# Patient Record
Sex: Female | Born: 1945
Health system: Southern US, Community
[De-identification: ages and names within clinical notes are randomized; demographics above are authoritative.]

## PROBLEM LIST (undated history)

## (undated) DIAGNOSIS — K219 Gastro-esophageal reflux disease without esophagitis: Secondary | ICD-10-CM

## (undated) DIAGNOSIS — I447 Left bundle-branch block, unspecified: Secondary | ICD-10-CM

## (undated) DIAGNOSIS — M1990 Unspecified osteoarthritis, unspecified site: Secondary | ICD-10-CM

## (undated) DIAGNOSIS — J189 Pneumonia, unspecified organism: Secondary | ICD-10-CM

## (undated) HISTORY — PX: KNEE SURGERY: SHX244

## (undated) HISTORY — PX: EYE SURGERY: SHX253

## (undated) HISTORY — DX: Gastro-esophageal reflux disease without esophagitis: K21.9

## (undated) HISTORY — PX: LUMBAR FUSION: SHX111

## (undated) HISTORY — DX: Unspecified osteoarthritis, unspecified site: M19.90

## (undated) HISTORY — PX: TUBAL LIGATION: SHX77

## (undated) HISTORY — PX: JOINT REPLACEMENT: SHX530

---

## 1987-02-06 HISTORY — PX: TUBAL LIGATION: SHX77

## 2004-02-06 HISTORY — PX: JOINT REPLACEMENT: SHX530

## 2004-08-29 ENCOUNTER — Ambulatory Visit: Payer: Self-pay | Admitting: General Surgery

## 2004-09-21 ENCOUNTER — Ambulatory Visit: Payer: Self-pay | Admitting: Unknown Physician Specialty

## 2005-08-02 ENCOUNTER — Inpatient Hospital Stay: Payer: Self-pay | Admitting: General Practice

## 2005-09-24 ENCOUNTER — Ambulatory Visit: Payer: Self-pay | Admitting: Unknown Physician Specialty

## 2005-09-26 ENCOUNTER — Ambulatory Visit: Payer: Self-pay | Admitting: General Practice

## 2006-10-01 ENCOUNTER — Ambulatory Visit: Payer: Self-pay | Admitting: Unknown Physician Specialty

## 2006-12-03 ENCOUNTER — Ambulatory Visit: Payer: Self-pay | Admitting: Unknown Physician Specialty

## 2007-10-15 ENCOUNTER — Ambulatory Visit: Payer: Self-pay | Admitting: Unknown Physician Specialty

## 2007-10-28 ENCOUNTER — Ambulatory Visit: Payer: Self-pay | Admitting: Unknown Physician Specialty

## 2008-07-22 ENCOUNTER — Ambulatory Visit: Payer: Self-pay | Admitting: General Surgery

## 2008-10-02 ENCOUNTER — Emergency Department: Payer: Self-pay | Admitting: Emergency Medicine

## 2008-10-06 ENCOUNTER — Ambulatory Visit: Payer: Self-pay | Admitting: Family Medicine

## 2009-03-17 ENCOUNTER — Ambulatory Visit: Payer: Self-pay | Admitting: Unknown Physician Specialty

## 2009-09-13 ENCOUNTER — Ambulatory Visit: Payer: Self-pay | Admitting: General Surgery

## 2010-11-09 ENCOUNTER — Ambulatory Visit: Payer: Self-pay | Admitting: Unknown Physician Specialty

## 2011-05-14 ENCOUNTER — Ambulatory Visit: Payer: Self-pay | Admitting: Internal Medicine

## 2011-05-24 ENCOUNTER — Ambulatory Visit: Payer: Self-pay | Admitting: Specialist

## 2011-07-05 ENCOUNTER — Ambulatory Visit: Payer: Self-pay | Admitting: Specialist

## 2012-08-21 ENCOUNTER — Encounter: Payer: Self-pay | Admitting: *Deleted

## 2013-03-02 DIAGNOSIS — A088 Other specified intestinal infections: Secondary | ICD-10-CM | POA: Diagnosis not present

## 2013-07-29 IMAGING — US US EXTREM LOW VENOUS*R*
1 series · 17 of 24 positions shown · non-contrast
Comparison: none

REASON FOR EXAM: CR [id number] RT Leg Pain Swelling Eval DVT
COMMENTS:

[Series 1: us extrem low venous*right* · 17 of 24 slices shown]
[im 1/24]
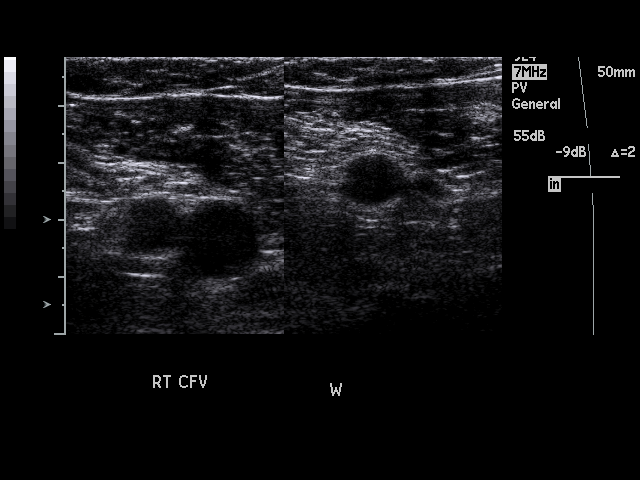
[im 3/24]
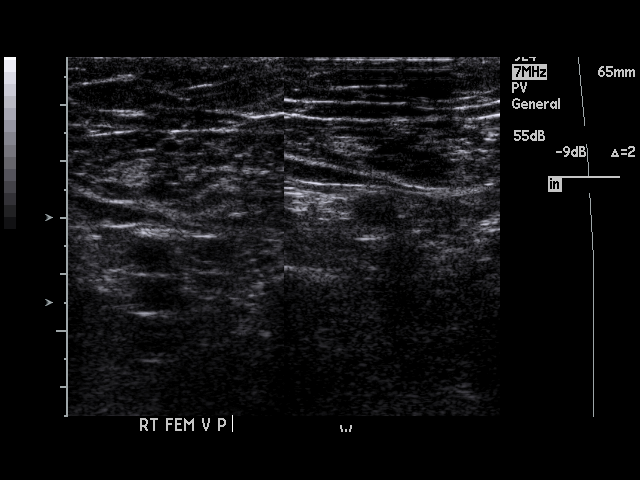
[im 4/24]
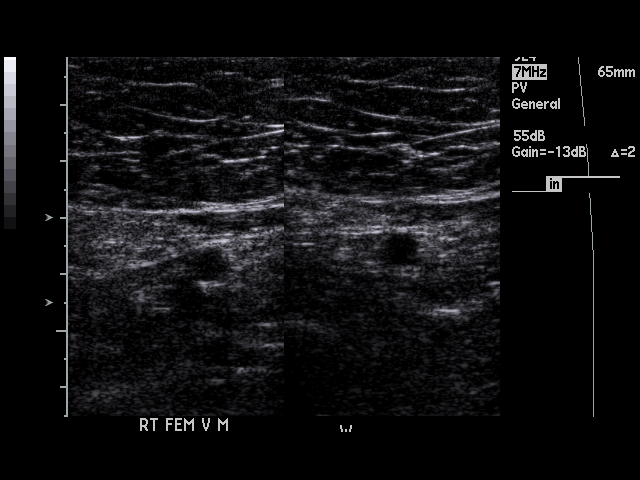
[im 5/24]
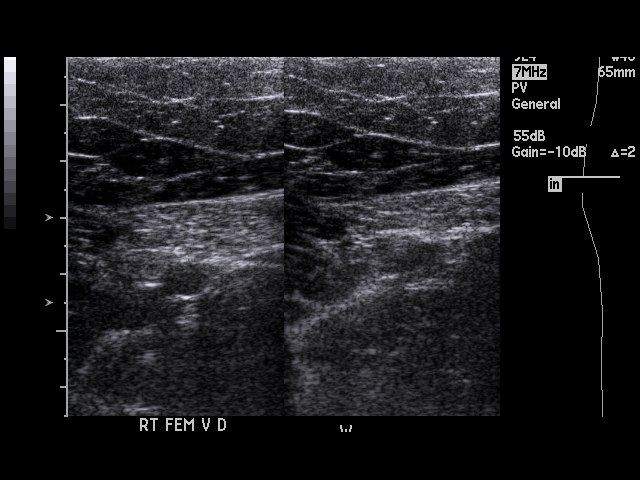
[im 7/24]
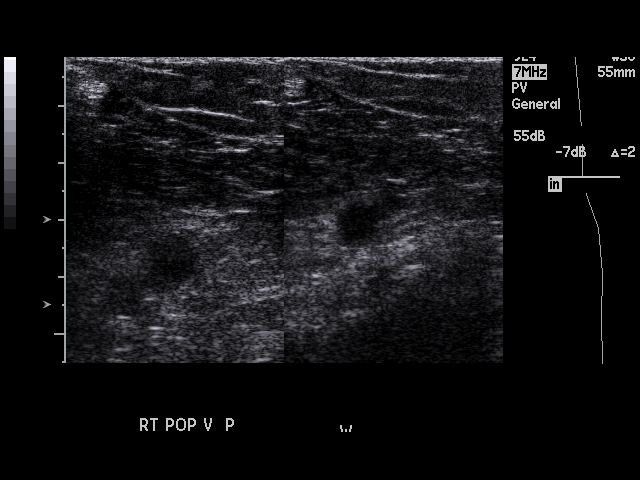
[im 8/24]
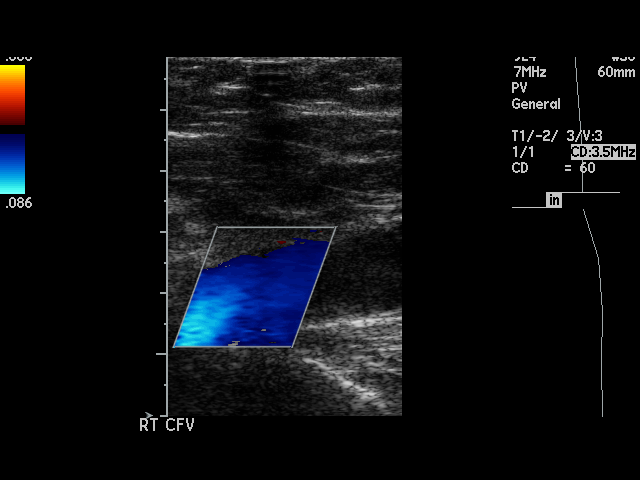
[im 10/24]
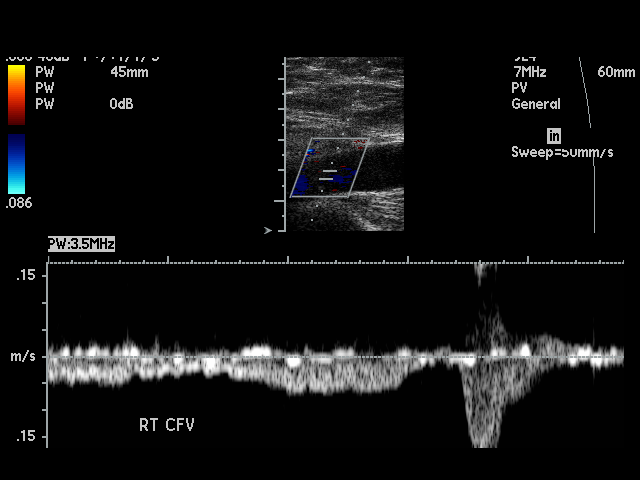
[im 11/24]
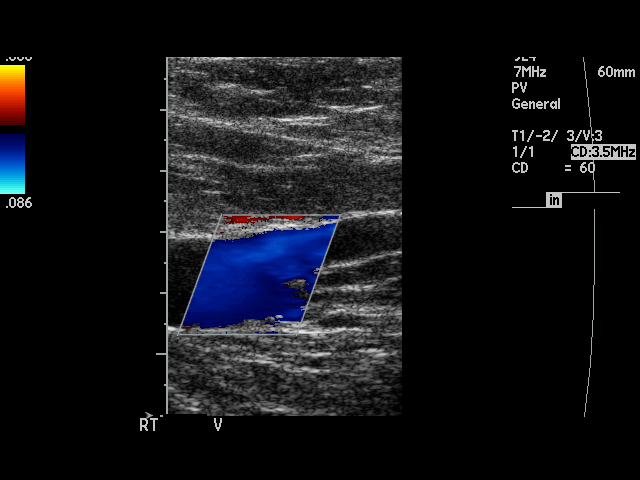
[im 13/24]
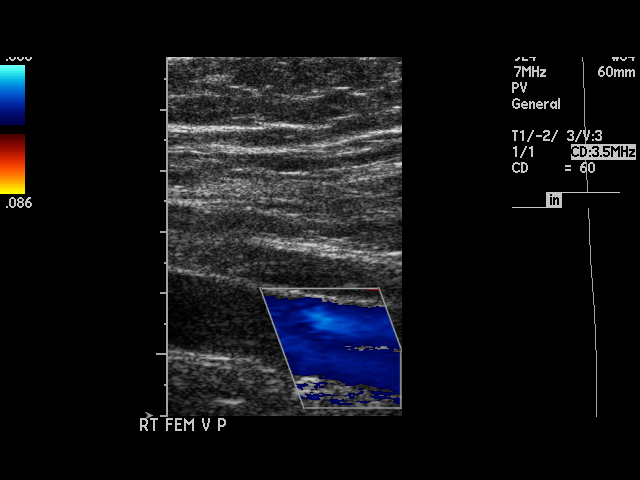
[im 14/24]
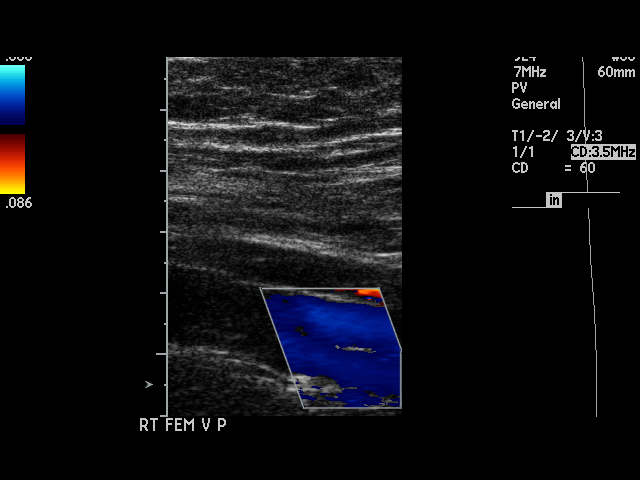
[im 15/24]
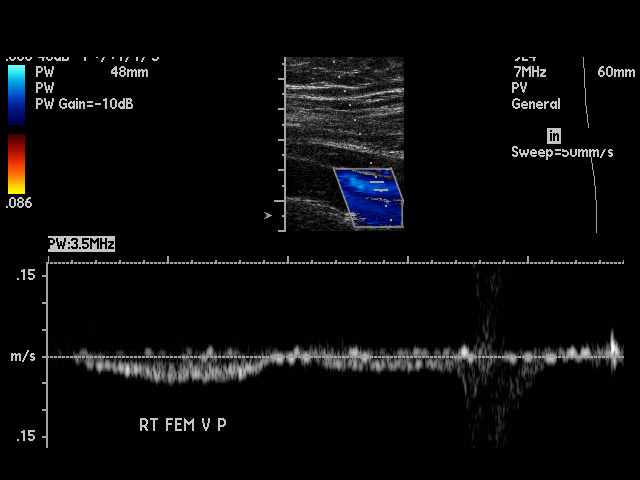
[im 17/24]
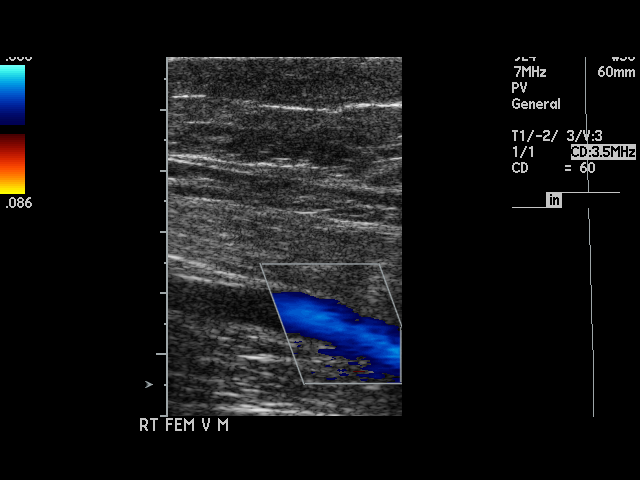
[im 18/24]
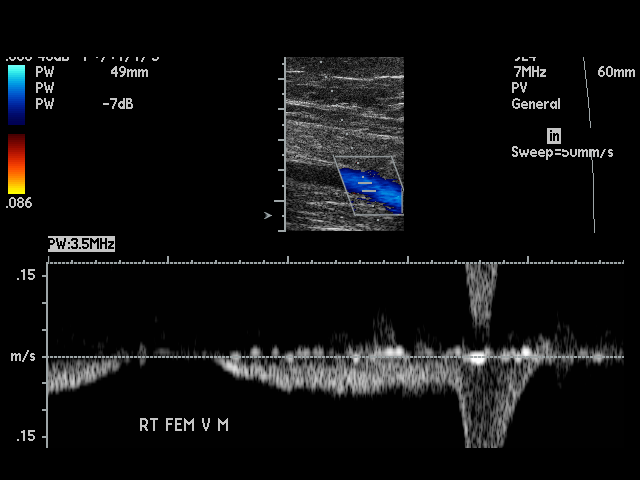
[im 20/24]
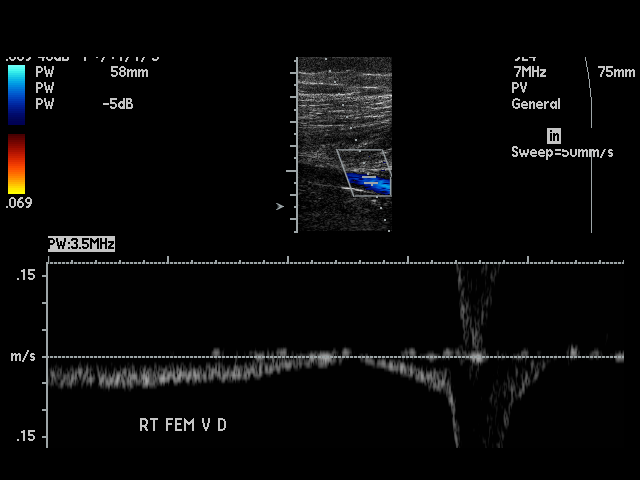
[im 21/24]
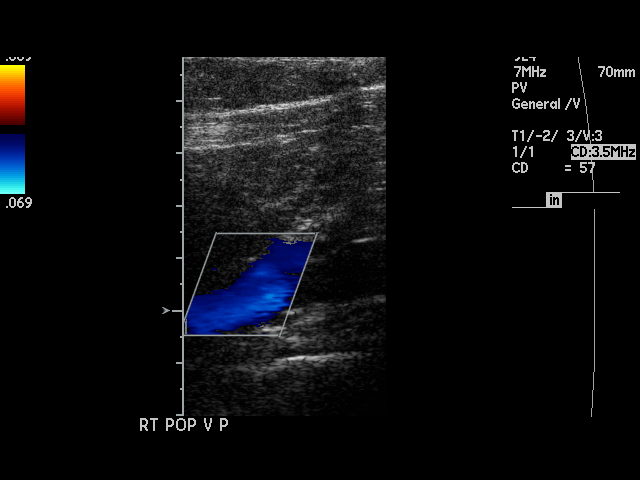
[im 22/24]
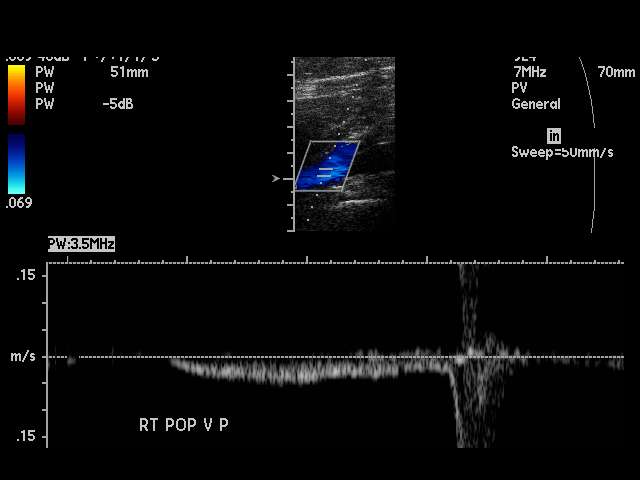
[im 24/24]
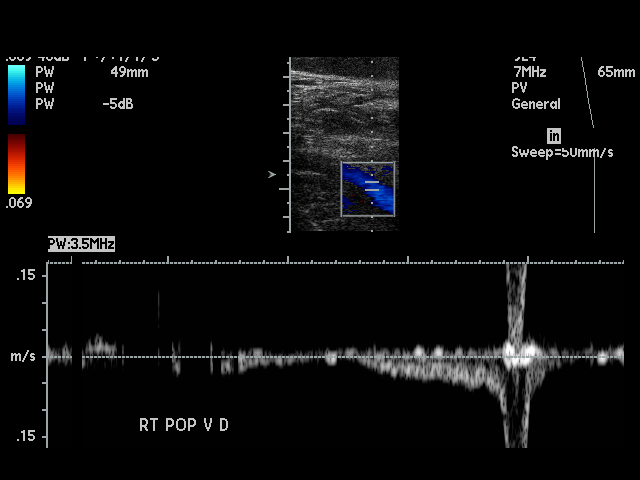

[17 of 24 positions shown; findings below may reference images not displayed]

PROCEDURE:     ROSALEE - ROSALEE DOPPLER VEINS RT LEG EXTR  - May 14, 2011  [DATE]

RESULT:     The phasic, augmentation and Valsalva flow waveforms are normal
in appearance. The right femoral and popliteal vein shows complete
compressibility throughout its course. Doppler examination shows no
occlusion or evidence for deep vein thrombosis.
IMPRESSION: 1.     No deep vein thrombosis is identified in the right leg.

## 2014-05-04 DIAGNOSIS — Z01419 Encounter for gynecological examination (general) (routine) without abnormal findings: Secondary | ICD-10-CM | POA: Diagnosis not present

## 2014-05-04 DIAGNOSIS — Z1211 Encounter for screening for malignant neoplasm of colon: Secondary | ICD-10-CM | POA: Diagnosis not present

## 2014-05-04 DIAGNOSIS — Z1231 Encounter for screening mammogram for malignant neoplasm of breast: Secondary | ICD-10-CM | POA: Diagnosis not present

## 2014-05-17 DIAGNOSIS — N390 Urinary tract infection, site not specified: Secondary | ICD-10-CM | POA: Diagnosis not present

## 2015-01-03 DIAGNOSIS — J069 Acute upper respiratory infection, unspecified: Secondary | ICD-10-CM | POA: Diagnosis not present

## 2015-01-03 DIAGNOSIS — R05 Cough: Secondary | ICD-10-CM | POA: Diagnosis not present

## 2015-02-09 ENCOUNTER — Ambulatory Visit (INDEPENDENT_AMBULATORY_CARE_PROVIDER_SITE_OTHER): Payer: Medicare Other | Admitting: Physician Assistant

## 2015-02-09 ENCOUNTER — Encounter: Payer: Self-pay | Admitting: Physician Assistant

## 2015-02-09 VITALS — BP 118/68 | HR 86 | Temp 97.7°F | Resp 16 | Ht 67.0 in | Wt 175.6 lb

## 2015-02-09 DIAGNOSIS — E7800 Pure hypercholesterolemia, unspecified: Secondary | ICD-10-CM | POA: Diagnosis not present

## 2015-02-09 DIAGNOSIS — Z7189 Other specified counseling: Secondary | ICD-10-CM

## 2015-02-09 DIAGNOSIS — Z87891 Personal history of nicotine dependence: Secondary | ICD-10-CM

## 2015-02-09 DIAGNOSIS — Z23 Encounter for immunization: Secondary | ICD-10-CM

## 2015-02-09 DIAGNOSIS — Z7689 Persons encountering health services in other specified circumstances: Secondary | ICD-10-CM

## 2015-02-09 NOTE — Patient Instructions (Signed)
Health Maintenance, Female Adopting a healthy lifestyle and getting preventive care can go a long way to promote health and wellness. Talk with your health care provider about what schedule of regular examinations is right for you. This is a good chance for you to check in with your provider about disease prevention and staying healthy. In between checkups, there are plenty of things you can do on your own. Experts have done a lot of research about which lifestyle changes and preventive measures are most likely to keep you healthy. Ask your health care provider for more information. WEIGHT AND DIET  Eat a healthy diet  Be sure to include plenty of vegetables, fruits, low-fat dairy products, and lean protein.  Do not eat a lot of foods high in solid fats, added sugars, or salt.  Get regular exercise. This is one of the most important things you can do for your health.  Most adults should exercise for at least 150 minutes each week. The exercise should increase your heart rate and make you sweat (moderate-intensity exercise).  Most adults should also do strengthening exercises at least twice a week. This is in addition to the moderate-intensity exercise.  Maintain a healthy weight  Body mass index (BMI) is a measurement that can be used to identify possible weight problems. It estimates body fat based on height and weight. Your health care provider can help determine your BMI and help you achieve or maintain a healthy weight.  For females 20 years of age and older:   A BMI below 18.5 is considered underweight.  A BMI of 18.5 to 24.9 is normal.  A BMI of 25 to 29.9 is considered overweight.  A BMI of 30 and above is considered obese.  Watch levels of cholesterol and blood lipids  You should start having your blood tested for lipids and cholesterol at 68 years of age, then have this test every 5 years.  You may need to have your cholesterol levels checked more often if:  Your lipid  or cholesterol levels are high.  You are older than 68 years of age.  You are at high risk for heart disease.  CANCER SCREENING   Lung Cancer  Lung cancer screening is recommended for adults 55-80 years old who are at high risk for lung cancer because of a history of smoking.  A yearly low-dose CT scan of the lungs is recommended for people who:  Currently smoke.  Have quit within the past 15 years.  Have at least a 30-pack-year history of smoking. A pack year is smoking an average of one pack of cigarettes a day for 1 year.  Yearly screening should continue until it has been 15 years since you quit.  Yearly screening should stop if you develop a health problem that would prevent you from having lung cancer treatment.  Breast Cancer  Practice breast self-awareness. This means understanding how your breasts normally appear and feel.  It also means doing regular breast self-exams. Let your health care provider know about any changes, no matter how small.  If you are in your 20s or 30s, you should have a clinical breast exam (CBE) by a health care provider every 1-3 years as part of a regular health exam.  If you are 40 or older, have a CBE every year. Also consider having a breast X-ray (mammogram) every year.  If you have a family history of breast cancer, talk to your health care provider about genetic screening.  If you   are at high risk for breast cancer, talk to your health care provider about having an MRI and a mammogram every year.  Breast cancer gene (BRCA) assessment is recommended for women who have family members with BRCA-related cancers. BRCA-related cancers include:  Breast.  Ovarian.  Tubal.  Peritoneal cancers.  Results of the assessment will determine the need for genetic counseling and BRCA1 and BRCA2 testing. Cervical Cancer Your health care provider may recommend that you be screened regularly for cancer of the pelvic organs (ovaries, uterus, and  vagina). This screening involves a pelvic examination, including checking for microscopic changes to the surface of your cervix (Pap test). You may be encouraged to have this screening done every 3 years, beginning at age 21.  For women ages 30-65, health care providers may recommend pelvic exams and Pap testing every 3 years, or they may recommend the Pap and pelvic exam, combined with testing for human papilloma virus (HPV), every 5 years. Some types of HPV increase your risk of cervical cancer. Testing for HPV may also be done on women of any age with unclear Pap test results.  Other health care providers may not recommend any screening for nonpregnant women who are considered low risk for pelvic cancer and who do not have symptoms. Ask your health care provider if a screening pelvic exam is right for you.  If you have had past treatment for cervical cancer or a condition that could lead to cancer, you need Pap tests and screening for cancer for at least 20 years after your treatment. If Pap tests have been discontinued, your risk factors (such as having a new sexual partner) need to be reassessed to determine if screening should resume. Some women have medical problems that increase the chance of getting cervical cancer. In these cases, your health care provider may recommend more frequent screening and Pap tests. Colorectal Cancer  This type of cancer can be detected and often prevented.  Routine colorectal cancer screening usually begins at 68 years of age and continues through 68 years of age.  Your health care provider may recommend screening at an earlier age if you have risk factors for colon cancer.  Your health care provider may also recommend using home test kits to check for hidden blood in the stool.  A small camera at the end of a tube can be used to examine your colon directly (sigmoidoscopy or colonoscopy). This is done to check for the earliest forms of colorectal  cancer.  Routine screening usually begins at age 50.  Direct examination of the colon should be repeated every 5-10 years through 68 years of age. However, you may need to be screened more often if early forms of precancerous polyps or small growths are found. Skin Cancer  Check your skin from head to toe regularly.  Tell your health care provider about any new moles or changes in moles, especially if there is a change in a mole's shape or color.  Also tell your health care provider if you have a mole that is larger than the size of a pencil eraser.  Always use sunscreen. Apply sunscreen liberally and repeatedly throughout the day.  Protect yourself by wearing long sleeves, pants, a wide-brimmed hat, and sunglasses whenever you are outside. HEART DISEASE, DIABETES, AND HIGH BLOOD PRESSURE   High blood pressure causes heart disease and increases the risk of stroke. High blood pressure is more likely to develop in:  People who have blood pressure in the high end   of the normal range (130-139/85-89 mm Hg).  People who are overweight or obese.  People who are African American.  If you are 38-23 years of age, have your blood pressure checked every 3-5 years. If you are 61 years of age or older, have your blood pressure checked every year. You should have your blood pressure measured twice--once when you are at a hospital or clinic, and once when you are not at a hospital or clinic. Record the average of the two measurements. To check your blood pressure when you are not at a hospital or clinic, you can use:  An automated blood pressure machine at a pharmacy.  A home blood pressure monitor.  If you are between 45 years and 39 years old, ask your health care provider if you should take aspirin to prevent strokes.  Have regular diabetes screenings. This involves taking a blood sample to check your fasting blood sugar level.  If you are at a normal weight and have a low risk for diabetes,  have this test once every three years after 68 years of age.  If you are overweight and have a high risk for diabetes, consider being tested at a younger age or more often. PREVENTING INFECTION  Hepatitis B  If you have a higher risk for hepatitis B, you should be screened for this virus. You are considered at high risk for hepatitis B if:  You were born in a country where hepatitis B is common. Ask your health care provider which countries are considered high risk.  Your parents were born in a high-risk country, and you have not been immunized against hepatitis B (hepatitis B vaccine).  You have HIV or AIDS.  You use needles to inject street drugs.  You live with someone who has hepatitis B.  You have had sex with someone who has hepatitis B.  You get hemodialysis treatment.  You take certain medicines for conditions, including cancer, organ transplantation, and autoimmune conditions. Hepatitis C  Blood testing is recommended for:  Everyone born from [birth year] through 1963.  Anyone with known risk factors for hepatitis C. Sexually transmitted infections (STIs)  You should be screened for sexually transmitted infections (STIs) including gonorrhea and chlamydia if:  You are sexually active and are younger than 68 years of age.  You are older than 68 years of age and your health care provider tells you that you are at risk for this type of infection.  Your sexual activity has changed since you were last screened and you are at an increased risk for chlamydia or gonorrhea. Ask your health care provider if you are at risk.  If you do not have HIV, but are at risk, it may be recommended that you take a prescription medicine daily to prevent HIV infection. This is called pre-exposure prophylaxis (PrEP). You are considered at risk if:  You are sexually active and do not regularly use condoms or know the HIV status of your partner(s).  You take drugs by injection.  You are sexually  active with a partner who has HIV. Talk with your health care provider about whether you are at high risk of being infected with HIV. If you choose to begin PrEP, you should first be tested for HIV. You should then be tested every 3 months for as long as you are taking PrEP.  PREGNANCY   If you are premenopausal and you may become pregnant, ask your health care provider about preconception counseling.  If you may  become pregnant, take 400 to 800 micrograms (mcg) of folic acid every day.  If you want to prevent pregnancy, talk to your health care provider about birth control (contraception). OSTEOPOROSIS AND MENOPAUSE   Osteoporosis is a disease in which the bones lose minerals and strength with aging. This can result in serious bone fractures. Your risk for osteoporosis can be identified using a bone density scan.  If you are 61 years of age or older, or if you are at risk for osteoporosis and fractures, ask your health care provider if you should be screened.  Ask your health care provider whether you should take a calcium or vitamin D supplement to lower your risk for osteoporosis.  Menopause may have certain physical symptoms and risks.  Hormone replacement therapy may reduce some of these symptoms and risks. Talk to your health care provider about whether hormone replacement therapy is right for you.  HOME CARE INSTRUCTIONS   Schedule regular health, dental, and eye exams.  Stay current with your immunizations.   Do not use any tobacco products including cigarettes, chewing tobacco, or electronic cigarettes.  If you are pregnant, do not drink alcohol.  If you are breastfeeding, limit how much and how often you drink alcohol.  Limit alcohol intake to no more than 1 drink per day for nonpregnant women. One drink equals 12 ounces of beer, 5 ounces of wine, or 1 ounces of hard liquor.  Do not use street drugs.  Do not share needles.  Ask your health care provider for help if  you need support or information about quitting drugs.  Tell your health care provider if you often feel depressed.  Tell your health care provider if you have ever been abused or do not feel safe at home.   This information is not intended to replace advice given to you by your health care provider. Make sure you discuss any questions you have with your health care provider.   Document Released: 08/07/2010 Document Revised: 02/12/2014 Document Reviewed: 12/24/2012 Elsevier Interactive Patient Education [employer].

## 2015-02-09 NOTE — Progress Notes (Signed)
Patient: Vanessa Dominguez, Female    DOB: 04/25/1945, 70 y.o.   MRN: [id number] Visit Date: 02/09/2015  Today's Provider: Margaretann Loveless, [name]   Chief Complaint  Patient presents with  . Establish Care   Subjective:    Establish Care:  Vanessa Dominguez is a 70 y.o. female who is here to establish care as a new patient. She feels well. She reports not exercising. She reports she is sleeping fairly well.  Patient had a mammogram,pap, and bone density 08/2014 by Dr. Arvil Chaco.  She has no complaints today.  She is in need of her Pneumovax and flu vaccine.    She does have history of TKR of left knee in 2007.  She does have history of hypercholesterolemia. -----------------------------------------------------------------   Review of Systems  Constitutional: Negative.   HENT: Negative.   Eyes: Negative.   Respiratory: Negative.   Cardiovascular: Negative.   Gastrointestinal: Negative.   Endocrine: Negative.   Genitourinary: Negative.   Musculoskeletal: Negative.   Skin: Negative.   Neurological: Negative.   Hematological: Negative.   Psychiatric/Behavioral: Negative.     Social History      She  reports that she has quit smoking. She does not have any smokeless tobacco history on file. She reports that she does not drink alcohol or use illicit drugs.       Social History   Social History  . Marital Status: Married    Spouse Name: N/A  . Number of Children: N/A  . Years of Education: N/A   Social History Main Topics  . Smoking status: Former Games developer  . Smokeless tobacco: None  . Alcohol Use: No  . Drug Use: No  . Sexual Activity: Not Asked   Other Topics Concern  . None   Social History Narrative    Past Medical History  Diagnosis Date  . Arthritis      There are no active problems to display for this patient.   Past Surgical History  Procedure Laterality Date  . Knee surgery Left [phone]  . Joint replacement Left 2006    Lt knee   . Tubal ligation  1989  . Cesarean section      Family History        Family Status  Relation Status Death Age  . Mother Deceased 52  . Father Deceased 61    Died from cancer        Her family history is not on file.    No Known Allergies  Previous Medications   No medications on file    Patient Care Team: Margaretann Loveless, [name] as PCP - General (Family Medicine)     Objective:   Vitals: BP 118/68 mmHg  Pulse 86  Temp(Src) 97.7 F (36.5 C) (Oral)  Resp 16  Ht 5\' 7"  (1.702 m)  Wt 175 lb 9.6 oz (79.652 kg)  BMI 27.50 kg/m2   Physical Exam  Constitutional: She is oriented to person, place, and time. She appears well-developed and well-nourished. No distress.  HENT:  Head: Normocephalic and atraumatic.  Right Ear: External ear normal.  Left Ear: External ear normal.  Nose: Nose normal.  Mouth/Throat: Oropharynx is clear and moist. No oropharyngeal exudate.  Eyes: Conjunctivae and EOM are normal. Pupils are equal, round, and reactive to light. Right eye exhibits no discharge. Left eye exhibits no discharge. No scleral icterus.  Neck: Normal range of motion. Neck supple. No JVD present. No tracheal deviation present. No  thyromegaly present.  Cardiovascular: Normal rate, regular rhythm, normal heart sounds and intact distal pulses.  Exam reveals no gallop and no friction rub.   No murmur heard. Pulmonary/Chest: Effort normal and breath sounds normal. No respiratory distress. She has no wheezes. She has no rales. She exhibits no tenderness.  Abdominal: Soft. Bowel sounds are normal. She exhibits no distension and no mass. There is no tenderness. There is no rebound and no guarding.  Musculoskeletal: Normal range of motion. She exhibits no edema or tenderness.  Lymphadenopathy:    She has no cervical adenopathy.  Neurological: She is alert and oriented to person, place, and time.  Skin: Skin is warm and dry. No rash noted. She is not diaphoretic.  Psychiatric: She  has a normal mood and affect. Her behavior is normal. Judgment and thought content normal.  Vitals reviewed.    Depression Screen No flowsheet data found.    Assessment & Plan:     Routine Health Maintenance and Physical Exam  1. Establishing care with new doctor, encounter for Sees Dr. Arvil ChacoVan Dalen also for OB/GYN and mammogram.  2. Need for influenza vaccination Flu vaccine given today without complication. - Flu Vaccine QUAD 36+ mos IM  3. Need for Streptococcus pneumoniae vaccination Pneumovax given today without complication.  She has already had Prevnar. - Pneumococcal polysaccharide vaccine 23-valent greater than or equal to 2yo subcutaneous/IM  4. Former smoker Will check labs as below and f/u pending lab results. If labs are WNL and stable will see back in one year for repeat exam. - Comprehensive Metabolic Panel (CMET) - CBC with Differential  5. Hypercholesteremia History of this.  Will check labs and f/u pending results. - Lipid Profile   Exercise Activities and Dietary recommendations Goals    None       There is no immunization history on file for this patient.  Health Maintenance  Topic Date Due  . Hepatitis C Screening  [birth year]  . TETANUS/TDAP  11/28/1966  . MAMMOGRAM  11/27/1997  . COLONOSCOPY  11/27/1997  . ZOSTAVAX  11/28/2007  . DEXA SCAN  11/27/2012  . PNA vac Low Risk Adult (1 of 2 - PCV13) 11/27/2012  . INFLUENZA VACCINE  09/06/2014      Discussed health benefits of physical activity, and encouraged her to engage in regular exercise appropriate for her age and condition.    --------------------------------------------------------------------

## 2015-02-10 ENCOUNTER — Telehealth: Payer: Self-pay

## 2015-02-10 LAB — CBC WITH DIFFERENTIAL/PLATELET
Basophils Absolute: 0 10*3/uL (ref 0.0–0.2)
Basos: 1 %
EOS (ABSOLUTE): 0.2 10*3/uL (ref 0.0–0.4)
EOS: 2 %
HEMATOCRIT: 42.2 % (ref 34.0–46.6)
HEMOGLOBIN: 14.5 g/dL (ref 11.1–15.9)
Immature Grans (Abs): 0 10*3/uL (ref 0.0–0.1)
Immature Granulocytes: 0 %
Lymphocytes Absolute: 2.8 10*3/uL (ref 0.7–3.1)
Lymphs: 34 %
MCH: 29.5 pg (ref 26.6–33.0)
MCHC: 34.4 g/dL (ref 31.5–35.7)
MCV: 86 fL (ref 79–97)
Monocytes Absolute: 0.6 10*3/uL (ref 0.1–0.9)
Monocytes: 8 %
NEUTROS PCT: 55 %
Neutrophils Absolute: 4.7 10*3/uL (ref 1.4–7.0)
Platelets: 321 10*3/uL (ref 150–379)
RBC: 4.92 x10E6/uL (ref 3.77–5.28)
RDW: 13.4 % (ref 12.3–15.4)
WBC: 8.3 10*3/uL (ref 3.4–10.8)

## 2015-02-10 LAB — LIPID PANEL
CHOL/HDL RATIO: 3.2 ratio (ref 0.0–4.4)
CHOLESTEROL TOTAL: 212 mg/dL — AB (ref 100–199)
HDL: 66 mg/dL (ref >39)
LDL Calculated: 107 mg/dL — ABNORMAL HIGH (ref 0–99)
TRIGLYCERIDES: 197 mg/dL — AB (ref 0–149)
VLDL Cholesterol Cal: 39 mg/dL (ref 5–40)

## 2015-02-10 LAB — COMPREHENSIVE METABOLIC PANEL
ALK PHOS: 100 IU/L (ref 39–117)
ALT: 20 IU/L (ref 0–32)
AST: 20 IU/L (ref 0–40)
Albumin/Globulin Ratio: 2 (ref 1.1–2.5)
Albumin: 4.5 g/dL (ref 3.6–4.8)
BUN/Creatinine Ratio: 18 (ref 11–26)
BUN: 16 mg/dL (ref 8–27)
Bilirubin Total: 0.2 mg/dL (ref 0.0–1.2)
CO2: 24 mmol/L (ref 18–29)
CREATININE: 0.9 mg/dL (ref 0.57–1.00)
Calcium: 9.7 mg/dL (ref 8.7–10.3)
Chloride: 98 mmol/L (ref 96–106)
GFR calc Af Amer: 77 mL/min/{1.73_m2} (ref >59)
GFR calc non Af Amer: 66 mL/min/{1.73_m2} (ref >59)
GLOBULIN, TOTAL: 2.2 g/dL (ref 1.5–4.5)
Glucose: 85 mg/dL (ref 65–99)
POTASSIUM: 4.9 mmol/L (ref 3.5–5.2)
SODIUM: 139 mmol/L (ref 134–144)
Total Protein: 6.7 g/dL (ref 6.0–8.5)

## 2015-02-10 NOTE — Telephone Encounter (Signed)
Patient advised as directed below. Patient voiced understanding.  Thanks,  -Megahn Killings 

## 2015-02-10 NOTE — Telephone Encounter (Signed)
-----   Message from Margaretann LovelessJennifer M Burnette, [place]PA-C sent at 02/10/2015  8:52 AM EST ----- All labs are within normal limits and stable with the exception of cholesterol which is very borderline elevated. Total cholesterol is slightly elevated at 212. I would like to see this below 200 if possible. LDL (bad) cholesterol is elevated at 107. I would like this number below 100 if possible. Triglycerides are also elevated at 197. I would like to see this below 150. Triglycerides are most closely related with your diet. Try to avoid eating high fat, high cholesterol foods. Make sure to try to be physically active with doing physical activity for 30-40 minutes 3-4 days per week. HDL (good) cholesterol is elevated as well at 66. This is great. This does offer cardioprotection and keeps you from having to be on any cholesterol-lowering medication. We will recheck labs in one year. Thanks! -JB

## 2015-09-28 DIAGNOSIS — H2513 Age-related nuclear cataract, bilateral: Secondary | ICD-10-CM | POA: Diagnosis not present

## 2015-09-29 ENCOUNTER — Encounter: Payer: Self-pay | Admitting: Family Medicine

## 2015-09-29 ENCOUNTER — Ambulatory Visit (INDEPENDENT_AMBULATORY_CARE_PROVIDER_SITE_OTHER): Payer: Medicare Other | Admitting: Family Medicine

## 2015-09-29 VITALS — BP 112/70 | HR 92 | Temp 97.9°F | Resp 16 | Wt 164.0 lb

## 2015-09-29 DIAGNOSIS — L259 Unspecified contact dermatitis, unspecified cause: Secondary | ICD-10-CM

## 2015-09-29 MED ORDER — PREDNISONE 20 MG PO TABS: ORAL_TABLET | ORAL | 0 refills | Status: DC

## 2015-09-29 MED ORDER — FLUOCINONIDE 0.05 % EX CREA: 1.0000 "application " | TOPICAL_CREAM | Freq: Three times a day (TID) | CUTANEOUS | 0 refills | Status: DC

## 2015-09-29 NOTE — Progress Notes (Signed)
Subjective:     Patient ID: Vanessa Dominguez, female   DOB: 10-08-1945, 70 y.o.   MRN: [id number]  HPI  Chief Complaint  Patient presents with  . Rash    Patient comes in office today with concerns of posion ivy. Patient states that she was pulling weeds the other ay when she noticed rash appear on her left arm, since rash started patient reports that it is covering her upper and lower extremity. Patient also reports itching of the face, patient has tried otc Anti-itch Spray  "I could not sleep last night due to the itching". Tried oral Benadryl with little relief. States she was wearing shorts and a short sleeved shirt while working.   Review of Systems     Objective:   Physical Exam  Constitutional: She appears well-developed and well-nourished. No distress.  Skin:  Papules in both a linear and discrete presentation on arms and upper chest. Face appear to be spared at the present time.       Assessment:    1. Contact dermatitis - fluocinonide cream (LIDEX) 0.05 %; Apply 1 application topically 3 (three) times daily. To poison ivy rash  Dispense: 30 g; Refill: 0 - predniSONE (DELTASONE) 20 MG tablet; Taper as follows: 3 pills for 4 days, two pills for 4 days, one pill for four days  Dispense: 24 tablet; Refill: 0    Plan:   To stat prednisone for facial involvement or no relief with topical steroids.

## 2015-09-29 NOTE — Patient Instructions (Signed)
Start the cream. The itching will improve before the rash goes away. If your rash develops in other areas especially the face or is not improving start the oral prednisone.

## 2015-10-27 DIAGNOSIS — H2512 Age-related nuclear cataract, left eye: Secondary | ICD-10-CM | POA: Diagnosis not present

## 2015-12-05 DIAGNOSIS — H2512 Age-related nuclear cataract, left eye: Secondary | ICD-10-CM | POA: Diagnosis not present

## 2015-12-13 ENCOUNTER — Ambulatory Visit: Payer: Medicare Other | Admitting: Anesthesiology

## 2015-12-13 ENCOUNTER — Ambulatory Visit
Admission: RE | Admit: 2015-12-13 | Discharge: 2015-12-13 | Disposition: A | Discharge status: Home / Self Care | Payer: Medicare Other | Source: Ambulatory Visit | Attending: Ophthalmology | Admitting: Ophthalmology

## 2015-12-13 ENCOUNTER — Encounter
Admission: RE | Disposition: A | Discharge status: Home / Self Care | Payer: Self-pay | Source: Ambulatory Visit | Attending: Ophthalmology

## 2015-12-13 ENCOUNTER — Encounter: Payer: Self-pay | Admitting: *Deleted

## 2015-12-13 DIAGNOSIS — H2512 Age-related nuclear cataract, left eye: Secondary | ICD-10-CM | POA: Insufficient documentation

## 2015-12-13 DIAGNOSIS — M1990 Unspecified osteoarthritis, unspecified site: Secondary | ICD-10-CM | POA: Diagnosis not present

## 2015-12-13 DIAGNOSIS — Z87891 Personal history of nicotine dependence: Secondary | ICD-10-CM | POA: Diagnosis not present

## 2015-12-13 HISTORY — DX: Pneumonia, unspecified organism: J18.9

## 2015-12-13 HISTORY — PX: CATARACT EXTRACTION W/PHACO: SHX586

## 2015-12-13 SURGERY — PHACOEMULSIFICATION, CATARACT, WITH IOL INSERTION
Anesthesia: Monitor Anesthesia Care | Site: Eye | Laterality: Left | Wound class: Clean

## 2015-12-13 MED ORDER — CARBACHOL 0.01 % IO SOLN
INTRAOCULAR | Status: DC | PRN
  Administered 2015-12-13: 0.5 mL via INTRAOCULAR

## 2015-12-13 MED ORDER — ARMC OPHTHALMIC DILATING DROPS
1.0000 "application " | OPHTHALMIC | Status: DC | PRN
  Administered 2015-12-13 (×3): 1 via OPHTHALMIC

## 2015-12-13 MED ORDER — LIDOCAINE HCL (PF) 4 % IJ SOLN
INTRAMUSCULAR | Status: AC
  Filled 2015-12-13: qty 5

## 2015-12-13 MED ORDER — FENTANYL CITRATE (PF) 100 MCG/2ML IJ SOLN
INTRAMUSCULAR | Status: DC | PRN
  Administered 2015-12-13: 50 ug via INTRAVENOUS

## 2015-12-13 MED ORDER — LIDOCAINE HCL (PF) 4 % IJ SOLN
INTRAOCULAR | Status: DC | PRN
  Administered 2015-12-13: 4 mL via OPHTHALMIC

## 2015-12-13 MED ORDER — SODIUM CHLORIDE 0.9 % IV SOLN
INTRAVENOUS | Status: DC
  Administered 2015-12-13 (×2): via INTRAVENOUS

## 2015-12-13 MED ORDER — MIDAZOLAM HCL 2 MG/2ML IJ SOLN
INTRAMUSCULAR | Status: DC | PRN
  Administered 2015-12-13: 1 mg via INTRAVENOUS

## 2015-12-13 MED ORDER — NA CHONDROIT SULF-NA HYALURON 40-17 MG/ML IO SOLN
INTRAOCULAR | Status: DC | PRN
  Administered 2015-12-13: 1 mL via INTRAOCULAR

## 2015-12-13 MED ORDER — NA CHONDROIT SULF-NA HYALURON 40-17 MG/ML IO SOLN
INTRAOCULAR | Status: AC
  Filled 2015-12-13: qty 1

## 2015-12-13 MED ORDER — POVIDONE-IODINE 5 % OP SOLN
OPHTHALMIC | Status: AC
  Filled 2015-12-13: qty 30

## 2015-12-13 MED ORDER — EPINEPHRINE PF 1 MG/ML IJ SOLN
INTRAOCULAR | Status: DC | PRN
  Administered 2015-12-13: 200 mL via OPHTHALMIC

## 2015-12-13 MED ORDER — MOXIFLOXACIN HCL 0.5 % OP SOLN
1.0000 [drp] | OPHTHALMIC | Status: DC | PRN
  Administered 2015-12-13 (×3): 1 [drp] via OPHTHALMIC

## 2015-12-13 MED ORDER — EPINEPHRINE PF 1 MG/ML IJ SOLN
INTRAMUSCULAR | Status: AC
  Filled 2015-12-13: qty 2

## 2015-12-13 MED ORDER — MOXIFLOXACIN HCL 0.5 % OP SOLN
OPHTHALMIC | Status: DC | PRN
  Administered 2015-12-13: 9 [drp] via OPHTHALMIC

## 2015-12-13 SURGICAL SUPPLY — 21 items
CANNULA ANT/CHMB 27GA (MISCELLANEOUS) ×3 IMPLANT
CUP MEDICINE 2OZ PLAST GRAD ST (MISCELLANEOUS) ×3 IMPLANT
GLOVE BIO SURGEON STRL SZ8 (GLOVE) ×3 IMPLANT
GLOVE BIOGEL M 6.5 STRL (GLOVE) ×3 IMPLANT
GLOVE SURG LX 8.0 MICRO (GLOVE) ×2
GLOVE SURG LX STRL 8.0 MICRO (GLOVE) ×1 IMPLANT
GOWN STRL REUS W/ TWL LRG LVL3 (GOWN DISPOSABLE) ×2 IMPLANT
GOWN STRL REUS W/TWL LRG LVL3 (GOWN DISPOSABLE) ×4
LENS IOL TECNIS ITEC 22.0 (Intraocular Lens) ×3 IMPLANT
PACK CATARACT (MISCELLANEOUS) ×3 IMPLANT
PACK CATARACT BRASINGTON LX (MISCELLANEOUS) ×3 IMPLANT
PACK EYE AFTER SURG (MISCELLANEOUS) ×3 IMPLANT
SOL BSS BAG (MISCELLANEOUS) ×3
SOL PREP PVP 2OZ (MISCELLANEOUS) ×3
SOLUTION BSS BAG (MISCELLANEOUS) ×1 IMPLANT
SOLUTION PREP PVP 2OZ (MISCELLANEOUS) ×1 IMPLANT
SYR 3ML LL SCALE MARK (SYRINGE) ×3 IMPLANT
SYR 5ML LL (SYRINGE) ×3 IMPLANT
SYR TB 1ML 27GX1/2 LL (SYRINGE) ×3 IMPLANT
WATER STERILE IRR 250ML POUR (IV SOLUTION) ×3 IMPLANT
WIPE NON LINTING 3.25X3.25 (MISCELLANEOUS) ×3 IMPLANT

## 2015-12-13 NOTE — Anesthesia Preprocedure Evaluation (Signed)
Anesthesia Evaluation  Patient identified by MRN, date of birth, ID band Patient awake    Reviewed: Allergy & Precautions, NPO status , Patient's Chart, lab work & pertinent test results  History of Anesthesia Complications Negative for: history of anesthetic complications  Airway Mallampati: II  TM Distance: >3 FB Neck ROM: Full    Dental  (+) Partial Upper   Pulmonary neg sleep apnea, neg COPD, former smoker,    breath sounds clear to auscultation- rhonchi (-) wheezing      Cardiovascular Exercise Tolerance: Good (-) hypertension(-) CAD and (-) Past MI  Rhythm:Regular Rate:Normal - Systolic murmurs and - Diastolic murmurs    Neuro/Psych negative neurological ROS  negative psych ROS   GI/Hepatic negative GI ROS, Neg liver ROS,   Endo/Other  negative endocrine ROSneg diabetes  Renal/GU negative Renal ROS     Musculoskeletal  (+) Arthritis ,   Abdominal (+) - obese,   Peds  Hematology negative hematology ROS (+)   Anesthesia Other Findings Past Medical History: No date: Arthritis   Reproductive/Obstetrics                            Anesthesia Physical Anesthesia Plan  ASA: II  Anesthesia Plan: MAC   Post-op Pain Management:    Induction: Intravenous  Airway Management Planned: Natural Airway  Additional Equipment:   Intra-op Plan:   Post-operative Plan:   Informed Consent: I have reviewed the patients History and Physical, chart, labs and discussed the procedure including the risks, benefits and alternatives for the proposed anesthesia with the patient or authorized representative who has indicated his/her understanding and acceptance.     Plan Discussed with: CRNA and Anesthesiologist  Anesthesia Plan Comments:         Anesthesia Quick Evaluation

## 2015-12-13 NOTE — Op Note (Signed)
PREOPERATIVE DIAGNOSIS:  Nuclear sclerotic cataract of the left eye.   POSTOPERATIVE DIAGNOSIS:  Nuclear sclerotic cataract of the left eye.   OPERATIVE PROCEDURE: Procedure(s): CATARACT EXTRACTION PHACO AND INTRAOCULAR LENS PLACEMENT (IOC)   SURGEON:  Galen ManilaWilliam Haroldine Redler, MD.   ANESTHESIA:  Anesthesiologist: Alver FisherAmy Penwarden, MD CRNA: Michaele OfferKasey Savage, CRNA  1.      Managed anesthesia care. 2.     0.71ml of Shugarcaine was instilled following the paracentesis   COMPLICATIONS:  None.   TECHNIQUE:   Stop and chop   DESCRIPTION OF PROCEDURE:  The patient was examined and consented in the preoperative holding area where the aforementioned topical anesthesia was applied to the left eye and then brought back to the Operating Room where the left eye was prepped and draped in the usual sterile ophthalmic fashion and a lid speculum was placed. A paracentesis was created with the side port blade and the anterior chamber was filled with viscoelastic. A near clear corneal incision was performed with the steel keratome. A continuous curvilinear capsulorrhexis was performed with a cystotome followed by the capsulorrhexis forceps. Hydrodissection and hydrodelineation were carried out with BSS on a blunt cannula. The lens was removed in a stop and chop  technique and the remaining cortical material was removed with the irrigation-aspiration handpiece. The capsular bag was inflated with viscoelastic and the Technis ZCB00 lens was placed in the capsular bag without complication. The remaining viscoelastic was removed from the eye with the irrigation-aspiration handpiece. The wounds were hydrated. The anterior chamber was flushed with Miostat and the eye was inflated to physiologic pressure. 0.31ml Vigamox was placed in the anterior chamber. The wounds were found to be water tight. The eye was dressed with Vigamox. The patient was given protective glasses to wear throughout the day and a shield with which to sleep tonight.  The patient was also given drops with which to begin a drop regimen today and will follow-up with me in one day.  Implant Name Type Inv. Item Serial No. Manufacturer Lot No. LRB No. Used  LENS IOL DIOP 22.0 - [id number] Intraocular Lens LENS IOL DIOP 22.0 [id number]   Left 1    Procedure(s) with comments: CATARACT EXTRACTION PHACO AND INTRAOCULAR LENS PLACEMENT (IOC) (Left) - Lot# [id number] H US:00:36.0 AP%: 23.7 CDE: 8.52  Electronically signed: Jozelynn Danielson LOUIS 12/13/2015 12:31 PM

## 2015-12-13 NOTE — H&P (Signed)
All labs reviewed. Abnormal studies sent to patients PCP when indicated.  Previous H&P reviewed, patient examined, there are NO CHANGES.  Vanessa Dominguez LOUIS11/7/201712:06 PM

## 2015-12-13 NOTE — Transfer of Care (Signed)
Immediate Anesthesia Transfer of Care Note  Patient: Vanessa Dominguez  Procedure(s) Performed: Procedure(s) with comments: CATARACT EXTRACTION PHACO AND INTRAOCULAR LENS PLACEMENT (IOC) (Left) - Lot# [id number] H US:00:36.0 AP%: 23.7 CDE: 8.52  Patient Location: PACU  Anesthesia Type:MAC  Level of Consciousness: awake, alert , oriented and patient cooperative  Airway & Oxygen Therapy: Patient Spontanous Breathing  Post-op Assessment: Report given to RN, Post -op Vital signs reviewed and stable and Patient moving all extremities X 4  Post vital signs: Reviewed and stable  Last Vitals:  Vitals:   12/13/15 1102 12/13/15 1233  BP: 133/72 127/72  Pulse: 80   Resp: 16 18  Temp: 36.3 C (!) 35.8 C    Last Pain:  Vitals:   12/13/15 1233  TempSrc: Oral         Complications: No apparent anesthesia complications

## 2015-12-13 NOTE — Anesthesia Postprocedure Evaluation (Signed)
Anesthesia Post Note  Patient: Vanessa Dominguez  Procedure(s) Performed: Procedure(s) (LRB): CATARACT EXTRACTION PHACO AND INTRAOCULAR LENS PLACEMENT (IOC) (Left)  Patient location during evaluation: PACU Anesthesia Type: MAC Level of consciousness: awake and alert Pain management: pain level controlled Vital Signs Assessment: post-procedure vital signs reviewed and stable Respiratory status: spontaneous breathing, nonlabored ventilation and respiratory function stable Cardiovascular status: stable and blood pressure returned to baseline Anesthetic complications: no    Last Vitals:  Vitals:   12/13/15 1102 12/13/15 1233  BP: 133/72 127/72  Pulse: 80   Resp: 16 18  Temp: 36.3 C (!) 35.8 C    Last Pain:  Vitals:   12/13/15 1233  TempSrc: Oral                 Michaele OfferSavage,  Fernando Torry A

## 2015-12-14 ENCOUNTER — Encounter: Payer: Self-pay | Admitting: Ophthalmology

## 2015-12-26 ENCOUNTER — Emergency Department
Admission: EM | Admit: 2015-12-26 | Discharge: 2015-12-26 | Disposition: A | Discharge status: Home / Self Care | Payer: Medicare Other | Attending: Emergency Medicine | Admitting: Emergency Medicine

## 2015-12-26 ENCOUNTER — Encounter: Payer: Self-pay | Admitting: Emergency Medicine

## 2015-12-26 DIAGNOSIS — R55 Syncope and collapse: Secondary | ICD-10-CM

## 2015-12-26 DIAGNOSIS — Z79899 Other long term (current) drug therapy: Secondary | ICD-10-CM | POA: Insufficient documentation

## 2015-12-26 DIAGNOSIS — Z87891 Personal history of nicotine dependence: Secondary | ICD-10-CM | POA: Insufficient documentation

## 2015-12-26 LAB — CBC
HEMATOCRIT: 42.3 % (ref 35.0–47.0)
Hemoglobin: 14.4 g/dL (ref 12.0–16.0)
MCH: 30.4 pg (ref 26.0–34.0)
MCHC: 33.9 g/dL (ref 32.0–36.0)
MCV: 89.5 fL (ref 80.0–100.0)
Platelets: 265 10*3/uL (ref 150–440)
RBC: 4.73 MIL/uL (ref 3.80–5.20)
RDW: 14.4 % (ref 11.5–14.5)
WBC: 5.7 10*3/uL (ref 3.6–11.0)

## 2015-12-26 LAB — BASIC METABOLIC PANEL
Anion gap: 8 (ref 5–15)
BUN: 16 mg/dL (ref 6–20)
CHLORIDE: 104 mmol/L (ref 101–111)
CO2: 26 mmol/L (ref 22–32)
Calcium: 9.3 mg/dL (ref 8.9–10.3)
Creatinine, Ser: 0.81 mg/dL (ref 0.44–1.00)
GFR calc non Af Amer: >60 mL/min (ref >60)
Glucose, Bld: 126 mg/dL — ABNORMAL HIGH (ref 65–99)
POTASSIUM: 4.1 mmol/L (ref 3.5–5.1)
SODIUM: 138 mmol/L (ref 135–145)

## 2015-12-26 LAB — GLUCOSE, CAPILLARY: Glucose-Capillary: 117 mg/dL — ABNORMAL HIGH (ref 65–99)

## 2015-12-26 LAB — TROPONIN I

## 2015-12-26 NOTE — Discharge Instructions (Signed)
As we discussed please return to the emergency department for any further syncopal events (passing out), any chest pain, trouble breathing, or any other symptom personally concerning to yourself.

## 2015-12-26 NOTE — ED Provider Notes (Signed)
[facility] Emergency Department Provider Note  Time seen: 3:36 PM  I have reviewed the triage vital signs and the nursing notes.   HISTORY  Chief Complaint Loss of Consciousness    HPI Vanessa Dominguez is a 70 y.o. female who presents to the emergency department after syncopal episode. According to the patient she was walking into the bank today when she twisted her right knee. States significant pain after twisting the knee approximately 1 minute later she had a syncopal episode. Patient denies any history of sig pain in the past. Denies any chest pain or trouble breathing. EMS was called, EMS checked the patient out she felt better so she told them she would go to her doctor to get evaluated. Patient called her doctor but they could not see her today so they told her to go to the urgent care. Patient went to the urgent care and a center to the ER for evaluation. Patient denies any chest pain or trouble breathing. Denies any leg pain or swelling besides right knee pain which has completely resolved at this time. Patient has been ambulatory since the event without difficulty.  Past Medical History:  Diagnosis Date  . Arthritis   . Pneumonia     There are no active problems to display for this patient.   Past Surgical History:  Procedure Laterality Date  . CATARACT EXTRACTION W/PHACO Left 12/13/2015   Procedure: CATARACT EXTRACTION PHACO AND INTRAOCULAR LENS PLACEMENT (IOC);  Surgeon: Galen ManilaWilliam Porfilio, MD;  Location: [facility] ORS;  Service: Ophthalmology;  Laterality: Left;  Lot# [id number] H US:00:36.0 AP%: 23.7 CDE: 8.52  . CESAREAN SECTION    . JOINT REPLACEMENT Left 2006   Lt knee  . KNEE SURGERY Left [phone],1996  . TUBAL LIGATION  1989    Prior to Admission medications   Medication Sig Start Date End Date Taking? Authorizing Provider  Calcium Carbonate (CALCIUM 600 PO) Take 1 tablet by mouth daily.    Historical Provider, MD  Cyanocobalamin (VITAMIN  B-12 PO) Take 500 mcg by mouth daily.    Historical Provider, MD  Doxylamine Succinate, Sleep, (EQ SLEEP AID PO) Take 1 tablet by mouth at bedtime as needed (sleep).    Historical Provider, MD  fluocinonide cream (LIDEX) 0.05 % Apply 1 application topically 3 (three) times daily. To poison ivy rash Patient not taking: Reported on 12/07/2015 09/29/15   Anola Gurneyobert Chauvin, PA  Omega-3 Fatty Acids (FISH OIL PO) Take 1 capsule by mouth daily.    Historical Provider, MD  predniSONE (DELTASONE) 20 MG tablet Taper as follows: 3 pills for 4 days, two pills for 4 days, one pill for four days Patient not taking: Reported on 12/07/2015 09/29/15   Anola Gurneyobert Chauvin, PA  vitamin C (ASCORBIC ACID) 500 MG tablet Take 500 mg by mouth daily.    Historical Provider, MD    No Known Allergies  No family history on file.  Social History Social History  Substance Use Topics  . Smoking status: Former Games developermoker  . Smokeless tobacco: Never Used  . Alcohol use No    Review of Systems Constitutional: Negative for fever. Cardiovascular: Negative for chest pain. Respiratory: Negative for shortness of breath. Gastrointestinal: Negative for abdominal pain Musculoskeletal: Right knee pain, now resolved Neurological: Negative for headache 10-point ROS otherwise negative.  ____________________________________________   PHYSICAL EXAM:  VITAL SIGNS: ED Triage Vitals [12/26/15 1205]  Enc Vitals Group     BP 127/68     Pulse Rate 65  Resp 18     Temp 97.5 F (36.4 C)     Temp Source Oral     SpO2 98 %     Weight 143 lb (64.9 kg)     Height 5\' 7"  (1.702 m)     Head Circumference      Peak Flow      Pain Score 0     Pain Loc      Pain Edu?      Excl. in GC?     Constitutional: Alert and oriented. Well appearing and in no distress. Eyes: Normal exam ENT   Head: Normocephalic and atraumatic.   Mouth/Throat: Mucous membranes are moist. Cardiovascular: Normal rate, regular rhythm. No murmur Respiratory:  Normal respiratory effort without tachypnea nor retractions. Breath sounds are clear  Gastrointestinal: Soft and nontender. No distention. Musculoskeletal: Nontender with normal range of motion in all extremities. No lower extremity tenderness or edema. Neurologic:  Normal speech and language. No gross focal neurologic deficits Skin:  Skin is warm, dry and intact.  Psychiatric: Mood and affect are normal  ____________________________________________    EKG  EKG reviewed and interpreted by myself shows normal sinus rhythm at 62 bpm, widened QRS with left axis deviation most consistent with left bundle branch block. No concerning ST changes.  ____________________________________________   INITIAL IMPRESSION / ASSESSMENT AND PLAN / ED COURSE  Pertinent labs & imaging results that were available during my care of the patient were reviewed by me and considered in my medical decision making (see chart for details).  Patient presents the emergency department after syncopal episode. Patient states she had twisted her right knee with severe pain and passed out approximately 1 minute after doing so. Patient states since the pain is gone away. She has been a bit without difficulty. Denies any chest pains or breathing nausea or diaphoresis at any point. States she feels normal. Patient's initial basic workup including BMP and CBC are normal. Patient does have a left bundle branch block which appears to be unchanged since 2010. I discussed with patient obtaining cardiac enzymes. Patient states that she has been here over 3 hours she does not wish to wait any longer for blood work. States she feels fine and wishes to go home. We will discharge the patient at this time she states she'll follow up with her primary care doctor. I discussed strict return precautions for any further syncopal events, chest pain or trouble breathing.  ____________________________________________   FINAL CLINICAL IMPRESSION(S)  / ED DIAGNOSES  Syncope    Minna AntisKevin Jerrika Ledlow, MD 12/26/15 1540

## 2015-12-26 NOTE — ED Triage Notes (Signed)
Pt reports had a syncopal episode today when leaving the bank. Pt states came out of bank, twisted knee then passed out. Pt denies knee pain or injury. Pt denies any recent illness, states she ate breakfast this morning. Pt alert and oriented in triage. No apparent distress noted.

## 2016-01-04 DIAGNOSIS — H2511 Age-related nuclear cataract, right eye: Secondary | ICD-10-CM | POA: Diagnosis not present

## 2016-01-05 ENCOUNTER — Inpatient Hospital Stay: Payer: Medicare Other | Admitting: Physician Assistant

## 2016-01-06 ENCOUNTER — Encounter: Payer: Self-pay | Admitting: *Deleted

## 2016-01-10 ENCOUNTER — Encounter: Payer: Self-pay | Admitting: *Deleted

## 2016-01-10 ENCOUNTER — Ambulatory Visit: Payer: Medicare Other | Admitting: Anesthesiology

## 2016-01-10 ENCOUNTER — Ambulatory Visit
Admission: RE | Admit: 2016-01-10 | Discharge: 2016-01-10 | Disposition: A | Discharge status: Home / Self Care | Payer: Medicare Other | Source: Ambulatory Visit | Attending: Ophthalmology | Admitting: Ophthalmology

## 2016-01-10 ENCOUNTER — Encounter
Admission: RE | Disposition: A | Discharge status: Home / Self Care | Payer: Self-pay | Source: Ambulatory Visit | Attending: Ophthalmology

## 2016-01-10 DIAGNOSIS — H2511 Age-related nuclear cataract, right eye: Secondary | ICD-10-CM | POA: Diagnosis not present

## 2016-01-10 DIAGNOSIS — Z87891 Personal history of nicotine dependence: Secondary | ICD-10-CM | POA: Diagnosis not present

## 2016-01-10 HISTORY — PX: CATARACT EXTRACTION W/PHACO: SHX586

## 2016-01-10 SURGERY — PHACOEMULSIFICATION, CATARACT, WITH IOL INSERTION
Anesthesia: Monitor Anesthesia Care | Site: Eye | Laterality: Right | Wound class: Clean

## 2016-01-10 MED ORDER — NA CHONDROIT SULF-NA HYALURON 40-17 MG/ML IO SOLN
INTRAOCULAR | Status: AC
  Filled 2016-01-10: qty 1

## 2016-01-10 MED ORDER — MOXIFLOXACIN HCL 0.5 % OP SOLN
OPHTHALMIC | Status: DC | PRN
  Administered 2016-01-10: 9 [drp] via OPHTHALMIC

## 2016-01-10 MED ORDER — SODIUM CHLORIDE 0.9 % IV SOLN
INTRAVENOUS | Status: DC
  Administered 2016-01-10: 11:00:00 via INTRAVENOUS

## 2016-01-10 MED ORDER — LIDOCAINE HCL (PF) 4 % IJ SOLN
INTRAOCULAR | Status: DC | PRN
  Administered 2016-01-10: 4 mL via OPHTHALMIC

## 2016-01-10 MED ORDER — ARMC OPHTHALMIC DILATING DROPS
1.0000 "application " | OPHTHALMIC | Status: AC
  Administered 2016-01-10 (×3): 1 via OPHTHALMIC

## 2016-01-10 MED ORDER — EPINEPHRINE PF 1 MG/ML IJ SOLN
INTRAMUSCULAR | Status: AC
  Filled 2016-01-10: qty 2

## 2016-01-10 MED ORDER — ARMC OPHTHALMIC DILATING DROPS
OPHTHALMIC | Status: AC
  Administered 2016-01-10: 1 via OPHTHALMIC
  Filled 2016-01-10: qty 0.4

## 2016-01-10 MED ORDER — EPINEPHRINE PF 1 MG/ML IJ SOLN
INTRAOCULAR | Status: DC | PRN
  Administered 2016-01-10: 200 mL via OPHTHALMIC

## 2016-01-10 MED ORDER — LIDOCAINE HCL (PF) 4 % IJ SOLN
INTRAMUSCULAR | Status: AC
  Filled 2016-01-10: qty 5

## 2016-01-10 MED ORDER — MOXIFLOXACIN HCL 0.5 % OP SOLN
1.0000 [drp] | OPHTHALMIC | Status: AC
  Administered 2016-01-10: 1 [drp] via OPHTHALMIC

## 2016-01-10 MED ORDER — POVIDONE-IODINE 5 % OP SOLN
OPHTHALMIC | Status: AC
  Filled 2016-01-10: qty 30

## 2016-01-10 MED ORDER — NA CHONDROIT SULF-NA HYALURON 40-17 MG/ML IO SOLN
INTRAOCULAR | Status: DC | PRN
  Administered 2016-01-10: 1 mL via INTRAOCULAR

## 2016-01-10 MED ORDER — CARBACHOL 0.01 % IO SOLN
INTRAOCULAR | Status: DC | PRN
  Administered 2016-01-10: 0.5 mL via INTRAOCULAR

## 2016-01-10 MED ORDER — MOXIFLOXACIN HCL 0.5 % OP SOLN
OPHTHALMIC | Status: AC
  Administered 2016-01-10: 1 [drp] via OPHTHALMIC
  Filled 2016-01-10: qty 3

## 2016-01-10 MED ORDER — FENTANYL CITRATE (PF) 100 MCG/2ML IJ SOLN
INTRAMUSCULAR | Status: DC | PRN
  Administered 2016-01-10: 50 ug via INTRAVENOUS

## 2016-01-10 MED ORDER — MIDAZOLAM HCL 2 MG/2ML IJ SOLN
INTRAMUSCULAR | Status: DC | PRN
  Administered 2016-01-10: 1 mg via INTRAVENOUS

## 2016-01-10 SURGICAL SUPPLY — 21 items
CANNULA ANT/CHMB 27GA (MISCELLANEOUS) ×3 IMPLANT
CUP MEDICINE 2OZ PLAST GRAD ST (MISCELLANEOUS) ×3 IMPLANT
GLOVE BIO SURGEON STRL SZ8 (GLOVE) ×3 IMPLANT
GLOVE BIOGEL M 6.5 STRL (GLOVE) ×3 IMPLANT
GLOVE SURG LX 8.0 MICRO (GLOVE) ×2
GLOVE SURG LX STRL 8.0 MICRO (GLOVE) ×1 IMPLANT
GOWN STRL REUS W/ TWL LRG LVL3 (GOWN DISPOSABLE) ×2 IMPLANT
GOWN STRL REUS W/TWL LRG LVL3 (GOWN DISPOSABLE) ×4
LENS IOL TECNIS ITEC 25.0 (Intraocular Lens) ×3 IMPLANT
PACK CATARACT (MISCELLANEOUS) ×3 IMPLANT
PACK CATARACT BRASINGTON LX (MISCELLANEOUS) ×3 IMPLANT
PACK EYE AFTER SURG (MISCELLANEOUS) ×3 IMPLANT
SOL BSS BAG (MISCELLANEOUS) ×3
SOL PREP PVP 2OZ (MISCELLANEOUS) ×3
SOLUTION BSS BAG (MISCELLANEOUS) ×1 IMPLANT
SOLUTION PREP PVP 2OZ (MISCELLANEOUS) ×1 IMPLANT
SYR 3ML LL SCALE MARK (SYRINGE) ×3 IMPLANT
SYR 5ML LL (SYRINGE) ×3 IMPLANT
SYR TB 1ML 27GX1/2 LL (SYRINGE) ×3 IMPLANT
WATER STERILE IRR 250ML POUR (IV SOLUTION) ×3 IMPLANT
WIPE NON LINTING 3.25X3.25 (MISCELLANEOUS) ×3 IMPLANT

## 2016-01-10 NOTE — Transfer of Care (Signed)
Immediate Anesthesia Transfer of Care Note  Patient: Vanessa Dominguez  Procedure(s) Performed: Procedure(s) with comments: CATARACT EXTRACTION PHACO AND INTRAOCULAR LENS PLACEMENT (IOC) (Right) - Lot # [id number] H US: 00:38.4 AP%: 21.3 CDE: 8.17  Patient Location: PACU  Anesthesia Type:MAC  Level of Consciousness: awake, alert  and oriented  Airway & Oxygen Therapy: Patient Spontanous Breathing  Post-op Assessment: Report given to RN and Post -op Vital signs reviewed and stable  Post vital signs: Reviewed and stable  Last Vitals:  Vitals:   01/10/16 1038 01/10/16 1207  BP: 133/73 134/67  Pulse: 76 71  Resp: 16 20  Temp: 36.4 C     Last Pain:  Vitals:   01/10/16 1207  TempSrc: Temporal  PainSc: 0-No pain         Complications: No apparent anesthesia complications

## 2016-01-10 NOTE — Op Note (Signed)
PREOPERATIVE DIAGNOSIS:  Nuclear sclerotic cataract of the right eye.   POSTOPERATIVE DIAGNOSIS:  right nuclear sclerotic cataract   OPERATIVE PROCEDURE: Procedure(s): CATARACT EXTRACTION PHACO AND INTRAOCULAR LENS PLACEMENT (IOC)   SURGEON:  Galen ManilaWilliam Charmaine Placido, MD.   ANESTHESIA:  Anesthesiologist: Naomie DeanWilliam K Kephart, MD CRNA: Marlana SalvageSandra Jessup, CRNA  1.      Managed anesthesia care. 2.      0.301ml of Shugarcaine was instilled in the eye following the paracentesis.   COMPLICATIONS:  None.   TECHNIQUE:   Stop and chop   DESCRIPTION OF PROCEDURE:  The patient was examined and consented in the preoperative holding area where the aforementioned topical anesthesia was applied to the right eye and then brought back to the Operating Room where the right eye was prepped and draped in the usual sterile ophthalmic fashion and a lid speculum was placed. A paracentesis was created with the side port blade and the anterior chamber was filled with viscoelastic. A near clear corneal incision was performed with the steel keratome. A continuous curvilinear capsulorrhexis was performed with a cystotome followed by the capsulorrhexis forceps. Hydrodissection and hydrodelineation were carried out with BSS on a blunt cannula. The lens was removed in a stop and chop  technique and the remaining cortical material was removed with the irrigation-aspiration handpiece. The capsular bag was inflated with viscoelastic and the Technis ZCB00  lens was placed in the capsular bag without complication. The remaining viscoelastic was removed from the eye with the irrigation-aspiration handpiece. The wounds were hydrated. The anterior chamber was flushed with Miostat and the eye was inflated to physiologic pressure. 0.851ml of Vigamox was placed in the anterior chamber. The wounds were found to be water tight. The eye was dressed with Vigamox. The patient was given protective glasses to wear throughout the day and a shield with which to  sleep tonight. The patient was also given drops with which to begin a drop regimen today and will follow-up with me in one day.  Implant Name Type Inv. Item Serial No. Manufacturer Lot No. LRB No. Used  LENS IOL DIOP 25.0 - [id number] Intraocular Lens LENS IOL DIOP 25.0 [id number] AMO   Right 1   Procedure(s) with comments: CATARACT EXTRACTION PHACO AND INTRAOCULAR LENS PLACEMENT (IOC) (Right) - Lot # [id number] H US: 00:38.4 AP%: 21.3 CDE: 8.17  Electronically signed: Ennis Heavner LOUIS 01/10/2016 12:04 PM

## 2016-01-10 NOTE — Anesthesia Preprocedure Evaluation (Signed)
Anesthesia Evaluation  Patient identified by MRN, date of birth, ID band Patient awake    Reviewed: Allergy & Precautions, NPO status , Patient's Chart, lab work & pertinent test results  History of Anesthesia Complications Negative for: history of anesthetic complications  Airway Mallampati: II     Mouth opening: Limited Mouth Opening  Dental   Pulmonary neg pulmonary ROS, former smoker,           Cardiovascular negative cardio ROS       Neuro/Psych negative neurological ROS     GI/Hepatic negative GI ROS, Neg liver ROS,   Endo/Other  negative endocrine ROS  Renal/GU negative Renal ROS     Musculoskeletal   Abdominal   Peds  Hematology   Anesthesia Other Findings   Reproductive/Obstetrics                             Anesthesia Physical Anesthesia Plan  ASA: II  Anesthesia Plan: MAC   Post-op Pain Management:    Induction: Intravenous  Airway Management Planned:   Additional Equipment:   Intra-op Plan:   Post-operative Plan:   Informed Consent: I have reviewed the patients History and Physical, chart, labs and discussed the procedure including the risks, benefits and alternatives for the proposed anesthesia with the patient or authorized representative who has indicated his/her understanding and acceptance.     Plan Discussed with:   Anesthesia Plan Comments:         Anesthesia Quick Evaluation

## 2016-01-10 NOTE — Anesthesia Postprocedure Evaluation (Signed)
Anesthesia Post Note  Patient: Vanessa Dominguez  Procedure(s) Performed: Procedure(s) (LRB): CATARACT EXTRACTION PHACO AND INTRAOCULAR LENS PLACEMENT (IOC) (Right)  Patient location during evaluation: PACU Anesthesia Type: MAC Level of consciousness: awake, awake and alert and oriented Pain management: pain level controlled Vital Signs Assessment: post-procedure vital signs reviewed and stable Respiratory status: spontaneous breathing, nonlabored ventilation and respiratory function stable Cardiovascular status: blood pressure returned to baseline Postop Assessment: no headache and no signs of nausea or vomiting Anesthetic complications: no    Last Vitals:  Vitals:   01/10/16 1038 01/10/16 1207  BP: 133/73 134/67  Pulse: 76 71  Resp: 16 20  Temp: 36.4 C     Last Pain:  Vitals:   01/10/16 1207  TempSrc: Temporal  PainSc: 0-No pain                 Marlana SalvageSandra Merrily Tegeler

## 2016-01-10 NOTE — Discharge Instructions (Addendum)
Eye Surgery Discharge Instructions  Expect mild scratchy sensation or mild soreness. DO NOT RUB YOUR EYE!  The day of surgery:  Minimal physical activity, but bed rest is not required  No reading, computer work, or close hand work  No bending, lifting, or straining.  May watch TV  For 24 hours:  No driving, legal decisions, or alcoholic beverages  Safety precautions  Eat anything you prefer: It is better to start with liquids, then soup then solid foods.  _____ Eye patch should be worn until postoperative exam tomorrow.  ____ Solar shield eyeglasses should be worn for comfort in the sunlight/patch while sleeping  Resume all regular medications including aspirin or Coumadin if these were discontinued prior to surgery. You may shower, bathe, shave, or wash your hair. Tylenol may be taken for mild discomfort.  Call your doctor if you experience significant pain, nausea, or vomiting, fever > 101 or other signs of infection. [phone] or [phone] Specific instructions:   FOLLOW DR PORFILIO'S POSTOP DISCHARGE INSTRUCTION EYE DROP SHEET AS REVIEWED.  Follow-up Information    PORFILIO,WILLIAM LOUIS, MD Follow up.   Specialty:  Ophthalmology Why:  wed 01/11/16 @ 9:40 am Contact information: [address] [place] [id number] [phone]

## 2016-01-10 NOTE — H&P (Signed)
All labs reviewed. Abnormal studies sent to patients PCP when indicated.  Previous H&P reviewed, patient examined, there are NO CHANGES.  Vanessa Dominguez LOUIS12/5/201711:38 AM

## 2016-02-22 ENCOUNTER — Encounter: Payer: Medicare Other | Admitting: Physician Assistant

## 2016-03-15 ENCOUNTER — Encounter: Payer: Self-pay | Admitting: Physician Assistant

## 2016-03-15 ENCOUNTER — Ambulatory Visit (INDEPENDENT_AMBULATORY_CARE_PROVIDER_SITE_OTHER): Payer: Medicare Other | Admitting: Physician Assistant

## 2016-03-15 VITALS — BP 102/60 | HR 99 | Temp 97.9°F | Resp 16 | Ht 67.0 in | Wt 139.4 lb

## 2016-03-15 DIAGNOSIS — Z1211 Encounter for screening for malignant neoplasm of colon: Secondary | ICD-10-CM

## 2016-03-15 DIAGNOSIS — R55 Syncope and collapse: Secondary | ICD-10-CM | POA: Diagnosis not present

## 2016-03-15 DIAGNOSIS — Z1239 Encounter for other screening for malignant neoplasm of breast: Secondary | ICD-10-CM

## 2016-03-15 DIAGNOSIS — K219 Gastro-esophageal reflux disease without esophagitis: Secondary | ICD-10-CM

## 2016-03-15 DIAGNOSIS — Z13220 Encounter for screening for lipoid disorders: Secondary | ICD-10-CM | POA: Diagnosis not present

## 2016-03-15 DIAGNOSIS — Z136 Encounter for screening for cardiovascular disorders: Secondary | ICD-10-CM | POA: Diagnosis not present

## 2016-03-15 DIAGNOSIS — Z0000 Encounter for general adult medical examination without abnormal findings: Secondary | ICD-10-CM | POA: Diagnosis not present

## 2016-03-15 DIAGNOSIS — Z1231 Encounter for screening mammogram for malignant neoplasm of breast: Secondary | ICD-10-CM | POA: Diagnosis not present

## 2016-03-15 DIAGNOSIS — Z124 Encounter for screening for malignant neoplasm of cervix: Secondary | ICD-10-CM | POA: Diagnosis not present

## 2016-03-15 DIAGNOSIS — Z23 Encounter for immunization: Secondary | ICD-10-CM

## 2016-03-15 DIAGNOSIS — Z1159 Encounter for screening for other viral diseases: Secondary | ICD-10-CM

## 2016-03-15 MED ORDER — OMEPRAZOLE 20 MG PO CPDR: 20.0000 mg | DELAYED_RELEASE_CAPSULE | Freq: Every day | ORAL | 3 refills | Status: DC

## 2016-03-15 NOTE — Patient Instructions (Signed)

## 2016-03-15 NOTE — Progress Notes (Signed)
Patient: Vanessa Dominguez, Female    DOB: Mar 20, 1945, 71 y.o.   MRN: [id number] Visit Date: 03/15/2016  Today's Provider: Margaretann Loveless, [name]   Chief Complaint  Patient presents with  . Medicare Wellness   Subjective:    Annual wellness visit Vanessa Dominguez is a 71 y.o. female. She feels well. She reports exercising. She reports she is sleeping fairly well. She is requesting a prescription for Omeprazole. She has been taking OTC and would like to see if it is cheaper with a prescription.  Pap and Mammogram at GYN. -----------------------------------------------------------   Review of Systems  Constitutional: Negative.   HENT: Negative.   Eyes: Negative.   Respiratory: Negative.   Cardiovascular: Negative.   Gastrointestinal: Negative.   Endocrine: Negative.   Genitourinary: Negative.   Musculoskeletal: Negative.   Skin: Negative.   Allergic/Immunologic: Negative.   Neurological: Negative.   Hematological: Negative.   Psychiatric/Behavioral: Negative.     Social History   Social History  . Marital status: Married    Spouse name: N/A  . Number of children: N/A  . Years of education: N/A   Occupational History  . Not on file.   Social History Main Topics  . Smoking status: Former Games developer  . Smokeless tobacco: Never Used  . Alcohol use No  . Drug use: No  . Sexual activity: Not on file   Other Topics Concern  . Not on file   Social History Narrative  . No narrative on file    Past Medical History:  Diagnosis Date  . Arthritis   . Pneumonia      There are no active problems to display for this patient.   Past Surgical History:  Procedure Laterality Date  . CATARACT EXTRACTION W/PHACO Left 12/13/2015   Procedure: CATARACT EXTRACTION PHACO AND INTRAOCULAR LENS PLACEMENT (IOC);  Surgeon: Galen Manila, MD;  Location: [facility] ORS;  Service: Ophthalmology;  Laterality: Left;  Lot# [id number] H US:00:36.0 AP%: 23.7 CDE: 8.52  . CATARACT  EXTRACTION W/PHACO Right 01/10/2016   Procedure: CATARACT EXTRACTION PHACO AND INTRAOCULAR LENS PLACEMENT (IOC);  Surgeon: Galen Manila, MD;  Location: [facility] ORS;  Service: Ophthalmology;  Laterality: Right;  Lot # [id number] H [place]: 00:38.4 AP%: 21.3 CDE: 8.17  . CESAREAN SECTION    . JOINT REPLACEMENT Left 2006   Lt knee  . KNEE SURGERY Left [phone]  . TUBAL LIGATION  1989    Her family history is not on file.      Current Outpatient Prescriptions:  .  Calcium Carbonate (CALCIUM 600 PO), Take 1 tablet by mouth daily., Disp: , Rfl:  .  Cyanocobalamin (VITAMIN B-12 PO), Take 500 mcg by mouth daily., Disp: , Rfl:  .  Doxylamine Succinate, Sleep, (EQ SLEEP AID PO), Take 1 tablet by mouth at bedtime as needed (sleep)., Disp: , Rfl:  .  fluocinonide cream (LIDEX) 0.05 %, Apply 1 application topically 3 (three) times daily. To poison ivy rash, Disp: 30 g, Rfl: 0 .  Omega-3 Fatty Acids (FISH OIL PO), Take 1 capsule by mouth daily., Disp: , Rfl:  .  vitamin C (ASCORBIC ACID) 500 MG tablet, Take 500 mg by mouth daily., Disp: , Rfl:   Patient Care Team: Margaretann Loveless, [name] as PCP - General (Family Medicine)     Objective:   Vitals: BP 102/60 (BP Location: Right Arm, Patient Position: Sitting, Cuff Size: Normal)   Pulse 99   Temp 97.9 F (36.6 C) (Oral)   Resp  16   Ht 5\' 7"  (1.702 m)   Wt 139 lb 6.4 oz (63.2 kg)   BMI 21.83 kg/m   Physical Exam  Constitutional: She is oriented to person, place, and time. She appears well-developed and well-nourished. No distress.  HENT:  Head: Normocephalic and atraumatic.  Right Ear: Hearing, tympanic membrane, external ear and ear canal normal.  Left Ear: Hearing, tympanic membrane, external ear and ear canal normal.  Nose: Nose normal.  Mouth/Throat: Uvula is midline, oropharynx is clear and moist and mucous membranes are normal. No oropharyngeal exudate.  Eyes: Conjunctivae and EOM are normal. Pupils are equal, round, and reactive to  light. Right eye exhibits no discharge. Left eye exhibits no discharge. No scleral icterus.  Neck: Normal range of motion. Neck supple. No JVD present. Carotid bruit is not present. No tracheal deviation present. No thyromegaly present.  Cardiovascular: Normal rate, regular rhythm, normal heart sounds and intact distal pulses.  Exam reveals no gallop and no friction rub.   No murmur heard. Pulmonary/Chest: Effort normal and breath sounds normal. No respiratory distress. She has no wheezes. She has no rales. She exhibits no tenderness.  Abdominal: Soft. Bowel sounds are normal. She exhibits no distension and no mass. There is no tenderness. There is no rebound and no guarding.  Musculoskeletal: Normal range of motion. She exhibits no edema or tenderness.  Lymphadenopathy:    She has no cervical adenopathy.  Neurological: She is alert and oriented to person, place, and time.  Skin: Skin is warm and dry. No rash noted. She is not diaphoretic.  Psychiatric: She has a normal mood and affect. Her behavior is normal. Judgment and thought content normal.  Vitals reviewed.   Activities of Daily Living In your present state of health, do you have any difficulty performing the following activities: 03/15/2016  Hearing? N  Vision? N  Difficulty concentrating or making decisions? N  Walking or climbing stairs? N  Dressing or bathing? N  Doing errands, shopping? N  Some recent data might be hidden    Fall Risk Assessment Fall Risk  03/15/2016  Falls in the past year? Yes  Number falls in past yr: 1  Injury with Fall? No     Depression Screen PHQ 2/9 Scores 03/15/2016  PHQ - 2 Score 0    Cognitive Testing - 6-CIT  Correct? Score   What year is it? yes 0 0 or 4  What month is it? yes 0 0 or 3  Memorize:    Floyde Parkins,  44,  [address]  [place],      What time is it? (within 1 hour) yes 0 0 or 3  Count backwards from 20 yes 0 0, 2, or 4  Name the months of the year yes 0 0, 2, or 4  Repeat  name & address above no 6 0, 2, 4, 6, 8, or 10       TOTAL SCORE  6/28   Interpretation:  Normal  Normal (0-7) Abnormal (8-28)    Audit-C Alcohol Use Screening  Question Answer Points  How often do you have alcoholic drink? never 0  On days you do drink alcohol, how many drinks do you typically consume? 0 0  How oftey will you drink 6 or more in a total? never 0  Total Score:  0   A score of 3 or more in women, and 4 or more in men indicates increased risk for alcohol abuse, EXCEPT if all of the  points are from question 1.    Assessment & Plan:     Annual Wellness Visit  Reviewed patient's Family Medical History Reviewed and updated list of patient's medical providers Assessment of cognitive impairment was done Assessed patient's functional ability Established a written schedule for health screening services Health Risk Assessent Completed and Reviewed  Exercise Activities and Dietary recommendations Goals    None      Immunization History  Administered Date(s) Administered  . Influenza,inj,Quad PF,36+ Mos 02/09/2015  . Pneumococcal Polysaccharide-23 02/09/2015  . Zoster 07/30/2012    Health Maintenance  Topic Date Due  . Hepatitis C Screening  30-Aug-1945  . TETANUS/TDAP  11/28/1966  . MAMMOGRAM  11/27/1997  . COLONOSCOPY  11/27/1997  . DEXA SCAN  11/27/2012  . INFLUENZA VACCINE  09/06/2015  . PNA vac Low Risk Adult (2 of 2 - PCV13) 02/09/2016  . ZOSTAVAX  Completed     Discussed health benefits of physical activity, and encouraged her to engage in regular exercise appropriate for her age and condition.    1. Medicare annual wellness visit, subsequent Normal exam today.  2. Breast cancer screening Done through GYN.  3. Colon cancer screening Patient refuses.  4. Cervical cancer screening Done through GYN normally but patient would like to start having them done at our office (was previously seen by Dr. Luella Cookosenow). H/O abnormal pap.  5. Vasovagal  syncope Stable. Has only occurred once. Will check labs as below and f/u pending results. - CBC w/Diff/Platelet - Comprehensive Metabolic Panel (CMET)  6. Encounter for lipid screening for cardiovascular disease Will check labs as below and f/u pending results. - Lipid Profile  7. Need for hepatitis C screening test - Hepatitis C Antibody  8. Need for influenza vaccination Flu vaccine given today without complication. Patient sat upright for 15 minutes to check for adverse reaction before being released. - Flu vaccine HIGH DOSE PF (Fluzone High dose)  9. Gastroesophageal reflux disease, esophagitis presence not specified Stable. Diagnosis pulled for medication refill. Continue current medical treatment plan. - omeprazole (PRILOSEC) 20 MG capsule; Take 1 capsule (20 mg total) by mouth daily.  Dispense: 30 capsule; Refill: 3  ------------------------------------------------------------------------------------------------------------    Margaretann LovelessJennifer M Dekari Bures, [name]  [facility]

## 2016-03-17 LAB — CBC WITH DIFFERENTIAL/PLATELET
BASOS: 1 %
Basophils Absolute: 0.1 10*3/uL (ref 0.0–0.2)
EOS (ABSOLUTE): 0.2 10*3/uL (ref 0.0–0.4)
Eos: 3 %
HEMOGLOBIN: 13.3 g/dL (ref 11.1–15.9)
Hematocrit: 43.4 % (ref 34.0–46.6)
IMMATURE GRANS (ABS): 0 10*3/uL (ref 0.0–0.1)
Immature Granulocytes: 0 %
LYMPHS ABS: 1.7 10*3/uL (ref 0.7–3.1)
LYMPHS: 29 %
MCH: 28.8 pg (ref 26.6–33.0)
MCHC: 30.6 g/dL — ABNORMAL LOW (ref 31.5–35.7)
MCV: 94 fL (ref 79–97)
MONOCYTES: 8 %
Monocytes Absolute: 0.5 10*3/uL (ref 0.1–0.9)
NEUTROS ABS: 3.5 10*3/uL (ref 1.4–7.0)
Neutrophils: 59 %
Platelets: 328 10*3/uL (ref 150–379)
RBC: 4.62 x10E6/uL (ref 3.77–5.28)
RDW: 14.5 % (ref 12.3–15.4)
WBC: 5.9 10*3/uL (ref 3.4–10.8)

## 2016-03-17 LAB — COMPREHENSIVE METABOLIC PANEL
A/G RATIO: 2 (ref 1.2–2.2)
ALBUMIN: 4.5 g/dL (ref 3.6–4.8)
ALT: 13 IU/L (ref 0–32)
AST: 16 IU/L (ref 0–40)
Alkaline Phosphatase: 85 IU/L (ref 39–117)
BILIRUBIN TOTAL: 0.5 mg/dL (ref 0.0–1.2)
BUN / CREAT RATIO: 19 (ref 12–28)
BUN: 15 mg/dL (ref 8–27)
CO2: 24 mmol/L (ref 18–29)
Calcium: 9.5 mg/dL (ref 8.7–10.3)
Chloride: 100 mmol/L (ref 96–106)
Creatinine, Ser: 0.79 mg/dL (ref 0.57–1.00)
GFR calc non Af Amer: 77 mL/min/{1.73_m2} (ref >59)
GFR, EST AFRICAN AMERICAN: 89 mL/min/{1.73_m2} (ref >59)
GLOBULIN, TOTAL: 2.3 g/dL (ref 1.5–4.5)
Glucose: 99 mg/dL (ref 65–99)
POTASSIUM: 5.3 mmol/L — AB (ref 3.5–5.2)
SODIUM: 141 mmol/L (ref 134–144)
TOTAL PROTEIN: 6.8 g/dL (ref 6.0–8.5)

## 2016-03-17 LAB — LIPID PANEL
CHOL/HDL RATIO: 3.2 ratio (ref 0.0–4.4)
CHOLESTEROL TOTAL: 189 mg/dL (ref 100–199)
HDL: 60 mg/dL (ref >39)
LDL CALC: 107 mg/dL — AB (ref 0–99)
TRIGLYCERIDES: 112 mg/dL (ref 0–149)
VLDL Cholesterol Cal: 22 mg/dL (ref 5–40)

## 2016-03-17 LAB — HEPATITIS C ANTIBODY: Hep C Virus Ab: <0.1 s/co ratio (ref 0.0–0.9)

## 2016-03-19 ENCOUNTER — Telehealth: Payer: Self-pay

## 2016-03-19 NOTE — Telephone Encounter (Signed)
Patient has been advised. KW 

## 2016-03-19 NOTE — Telephone Encounter (Signed)
-----   Message from Margaretann LovelessJennifer M Burnette, [place]PA-C sent at 03/19/2016  8:45 AM EST ----- All labs are within normal limits and stable.  Cholesterol has improved. Thanks! -JB

## 2016-06-07 ENCOUNTER — Other Ambulatory Visit: Payer: Self-pay | Admitting: Physician Assistant

## 2016-06-07 DIAGNOSIS — K219 Gastro-esophageal reflux disease without esophagitis: Secondary | ICD-10-CM

## 2016-11-02 ENCOUNTER — Encounter: Payer: Self-pay | Admitting: Physician Assistant

## 2016-11-02 LAB — FECAL OCCULT BLOOD, GUAIAC: Fecal Occult Blood: NEGATIVE

## 2016-11-09 LAB — FECAL OCCULT BLOOD, IMMUNOCHEMICAL: IFOBT: NEGATIVE

## 2017-01-28 ENCOUNTER — Other Ambulatory Visit: Payer: Self-pay | Admitting: Physician Assistant

## 2017-01-28 DIAGNOSIS — K219 Gastro-esophageal reflux disease without esophagitis: Secondary | ICD-10-CM

## 2017-01-31 ENCOUNTER — Telehealth: Payer: Self-pay

## 2017-01-31 NOTE — Telephone Encounter (Signed)
Called pt to schedule her AWV with [facility] prior to her physical on 02/19/17. Pt declined apt and states [facility] and does not need to do it again.  -MM

## 2017-02-19 ENCOUNTER — Other Ambulatory Visit: Payer: Self-pay

## 2017-02-19 ENCOUNTER — Ambulatory Visit (INDEPENDENT_AMBULATORY_CARE_PROVIDER_SITE_OTHER): Payer: Medicare Other | Admitting: Physician Assistant

## 2017-02-19 ENCOUNTER — Encounter: Payer: Self-pay | Admitting: Physician Assistant

## 2017-02-19 VITALS — BP 100/60 | HR 66 | Temp 97.8°F | Resp 16 | Ht 65.0 in | Wt 137.0 lb

## 2017-02-19 DIAGNOSIS — Z780 Asymptomatic menopausal state: Secondary | ICD-10-CM | POA: Diagnosis not present

## 2017-02-19 DIAGNOSIS — Z0000 Encounter for general adult medical examination without abnormal findings: Secondary | ICD-10-CM | POA: Diagnosis not present

## 2017-02-19 DIAGNOSIS — E875 Hyperkalemia: Secondary | ICD-10-CM | POA: Insufficient documentation

## 2017-02-19 DIAGNOSIS — Z1231 Encounter for screening mammogram for malignant neoplasm of breast: Secondary | ICD-10-CM

## 2017-02-19 DIAGNOSIS — Z1239 Encounter for other screening for malignant neoplasm of breast: Secondary | ICD-10-CM

## 2017-02-19 DIAGNOSIS — E7800 Pure hypercholesterolemia, unspecified: Secondary | ICD-10-CM | POA: Diagnosis not present

## 2017-02-19 DIAGNOSIS — Z8249 Family history of ischemic heart disease and other diseases of the circulatory system: Secondary | ICD-10-CM | POA: Diagnosis not present

## 2017-02-19 NOTE — Patient Instructions (Signed)

## 2017-02-19 NOTE — Progress Notes (Signed)
Patient: Vanessa Dominguez, Female    DOB: April 15, 1945, 72 y.o.   MRN: [id number] Visit Date: 02/19/2017  Today's Provider: Margaretann LovelessJennifer M Ronnesha Mester, [name]   Chief Complaint  Patient presents with  . Medicare Wellness   Subjective:    Annual wellness visit Vanessa Dominguez is a 72 y.o. female. She feels well. She reports exercising, she uses the stepper twice a day for 18 minutes. She reports she is sleeping fairly well.  CPE:03/15/16 FIT:11/02/16 Neg -----------------------------------------------------------   Review of Systems  Constitutional: Negative.   HENT: Negative.   Eyes: Negative.   Respiratory: Negative.   Cardiovascular: Negative.   Gastrointestinal: Negative.   Endocrine: Negative.   Genitourinary: Negative.   Musculoskeletal: Negative.   Skin: Negative.   Allergic/Immunologic: Negative.   Neurological: Negative.   Hematological: Negative.   Psychiatric/Behavioral: Negative.     Social History   Socioeconomic History  . Marital status: Married    Spouse name: Not on file  . Number of children: Not on file  . Years of education: Not on file  . Highest education level: Not on file  Social Needs  . Financial resource strain: Not on file  . Food insecurity - worry: Not on file  . Food insecurity - inability: Not on file  . Transportation needs - medical: Not on file  . Transportation needs - non-medical: Not on file  Occupational History  . Not on file  Tobacco Use  . Smoking status: Former Games developermoker  . Smokeless tobacco: Never Used  Substance and Sexual Activity  . Alcohol use: No  . Drug use: No  . Sexual activity: Not on file  Other Topics Concern  . Not on file  Social History Narrative  . Not on file    Past Medical History:  Diagnosis Date  . Arthritis   . Pneumonia      There are no active problems to display for this patient.   Past Surgical History:  Procedure Laterality Date  . CATARACT EXTRACTION W/PHACO Left 12/13/2015   Procedure: CATARACT EXTRACTION PHACO AND INTRAOCULAR LENS PLACEMENT (IOC);  Surgeon: Galen ManilaWilliam Porfilio, MD;  Location: [facility] ORS;  Service: Ophthalmology;  Laterality: Left;  Lot# [id number] H US:00:36.0 AP%: 23.7 CDE: 8.52  . CATARACT EXTRACTION W/PHACO Right 01/10/2016   Procedure: CATARACT EXTRACTION PHACO AND INTRAOCULAR LENS PLACEMENT (IOC);  Surgeon: Galen ManilaWilliam Porfilio, MD;  Location: [facility] ORS;  Service: Ophthalmology;  Laterality: Right;  Lot # [id number] H US: 00:38.4 AP%: 21.3 CDE: 8.17  . CESAREAN SECTION    . JOINT REPLACEMENT Left 2006   Lt knee  . KNEE SURGERY Left [id number]  . TUBAL LIGATION  1989    Her family history includes Heart attack in her brother.      Current Outpatient Medications:  .  Calcium Carbonate (CALCIUM 600 PO), Take 1 tablet by mouth daily., Disp: , Rfl:  .  Cyanocobalamin (VITAMIN B-12 PO), Take 500 mcg by mouth daily., Disp: , Rfl:  .  Doxylamine Succinate, Sleep, (EQ SLEEP AID PO), Take 1 tablet by mouth at bedtime as needed (sleep)., Disp: , Rfl:  .  fluocinonide cream (LIDEX) 0.05 %, Apply 1 application topically 3 (three) times daily. To poison ivy rash, Disp: 30 g, Rfl: 0 .  Omega-3 Fatty Acids (FISH OIL PO), Take 1 capsule by mouth daily., Disp: , Rfl:  .  omeprazole (PRILOSEC) 20 MG capsule, TAKE 1 CAPSULE BY MOUTH EVERY DAY, Disp: 30 capsule, Rfl: 5 .  vitamin C (ASCORBIC  ACID) 500 MG tablet, Take 500 mg by mouth daily., Disp: , Rfl:   Patient Care Team: Margaretann Loveless, [name] as PCP - General (Family Medicine)     Objective:   Vitals: BP 100/60 (BP Location: Right Arm, Patient Position: Sitting, Cuff Size: Normal)   Pulse 66   Temp 97.8 F (36.6 C) (Oral)   Resp 16   Ht 5\' 5"  (1.651 m)   Wt 137 lb (62.1 kg)   BMI 22.80 kg/m   Physical Exam  Constitutional: She is oriented to person, place, and time. She appears well-developed and well-nourished. No distress.  HENT:  Head: Normocephalic and atraumatic.  Right Ear: Hearing,  tympanic membrane, external ear and ear canal normal.  Left Ear: Hearing, tympanic membrane, external ear and ear canal normal.  Nose: Nose normal.  Mouth/Throat: Uvula is midline, oropharynx is clear and moist and mucous membranes are normal. No oropharyngeal exudate.  Eyes: Conjunctivae and EOM are normal. Pupils are equal, round, and reactive to light. Right eye exhibits no discharge. Left eye exhibits no discharge. No scleral icterus.  Neck: Normal range of motion. Neck supple. No JVD present. Carotid bruit is not present. No tracheal deviation present. No thyromegaly present.  Cardiovascular: Normal rate, regular rhythm, normal heart sounds and intact distal pulses. Exam reveals no gallop and no friction rub.  No murmur heard. Pulmonary/Chest: Effort normal and breath sounds normal. No respiratory distress. She has no wheezes. She has no rales. She exhibits no tenderness. Right breast exhibits no inverted nipple, no mass, no nipple discharge, no skin change and no tenderness. Left breast exhibits no inverted nipple, no mass, no nipple discharge, no skin change and no tenderness. Breasts are symmetrical.  Abdominal: Soft. Bowel sounds are normal. She exhibits no distension and no mass. There is no tenderness. There is no rebound and no guarding.  Musculoskeletal: Normal range of motion. She exhibits no edema or tenderness.  Lymphadenopathy:    She has no cervical adenopathy.  Neurological: She is alert and oriented to person, place, and time. She has normal reflexes.  Skin: Skin is warm and dry. No rash noted. She is not diaphoretic.  Psychiatric: She has a normal mood and affect. Her behavior is normal. Judgment and thought content normal.  Vitals reviewed.   Activities of Daily Living In your present state of health, do you have any difficulty performing the following activities: 02/19/2017 03/15/2016  Hearing? N N  Vision? N N  Difficulty concentrating or making decisions? N N  Walking or  climbing stairs? N N  Dressing or bathing? N N  Doing errands, shopping? N N  Some recent data might be hidden    Fall Risk Assessment Fall Risk  02/19/2017 03/15/2016  Falls in the past year? No Yes  Number falls in past yr: - 1  Injury with Fall? - No     Depression Screen PHQ 2/9 Scores 02/19/2017 03/15/2016  PHQ - 2 Score 0 0    Cognitive Testing - 6-CIT  Correct? Score   What year is it? yes 0 0 or 4  What month is it? yes 0 0 or 3  Memorize:    Floyde Parkins,  44,  [address],  [place],      What time is it? (within 1 hour) yes 0 0 or 3  Count backwards from 20 yes 0 0, 2, or 4  Name the months of the year yes 0 0, 2, or 4  Repeat name & address  above no 8 0, 2, 4, 6, 8, or 10       TOTAL SCORE  8/28   Interpretation:  Abnormal- 8  Normal (0-7) Abnormal (8-28)       Assessment & Plan:     Annual Wellness Visit  Reviewed patient's Family Medical History Reviewed and updated list of patient's medical providers Assessment of cognitive impairment was done Assessed patient's functional ability Established a written schedule for health screening services Health Risk Assessent Completed and Reviewed  Exercise Activities and Dietary recommendations Goals    None      Immunization History  Administered Date(s) Administered  . Influenza, High Dose Seasonal PF 03/15/2016  . Influenza,inj,Quad PF,6+ Mos 02/09/2015  . Influenza-Unspecified 11/09/2016  . Pneumococcal Polysaccharide-23 02/09/2015  . Zoster 07/30/2012    Health Maintenance  Topic Date Due  . Janet Berlin  11/28/1966  . MAMMOGRAM  11/27/1997  . DEXA SCAN  11/27/2012  . PNA vac Low Risk Adult (2 of 2 - PCV13) 02/09/2016  . COLON CANCER SCREENING ANNUAL FOBT  11/09/2017  . INFLUENZA VACCINE  Completed  . Hepatitis C Screening  Completed     Discussed health benefits of physical activity, and encouraged her to engage in regular exercise appropriate for her age and condition.    1. Medicare annual  wellness visit, subsequent Normal screening today.  2. Breast cancer screening Breast exam today was normal. There is no family history of breast cancer. She does perform regular self breast exams. Mammogram was ordered as below. Information for [facility] Breast clinic was given to patient so she may schedule her mammogram at her convenience. - MM DIGITAL SCREENING BILATERAL; Future  3. Family history of heart disease Brother just passed in 10/2016 from massive MI. Will check labs as below and f/u pending results. - CBC with Differential/Platelet - Comprehensive metabolic panel  4. Pure hypercholesterolemia Stable. Diet controlled. Will check labs as below and f/u pending results. - CBC with Differential/Platelet - Comprehensive metabolic panel - Lipid panel  5. Hyperkalemia Was elevated last year. Will check labs as below and f/u pending results. - CBC with Differential/Platelet - Comprehensive metabolic panel  6. Postmenopausal estrogen deficiency Bone density due. Ordered as below.  - DG Bone Density; Future  ------------------------------------------------------------------------------------------------------------    Margaretann Loveless, [name]  [facility]

## 2017-02-21 ENCOUNTER — Telehealth: Payer: Self-pay

## 2017-02-21 LAB — COMPREHENSIVE METABOLIC PANEL
ALBUMIN: 4.5 g/dL (ref 3.6–4.8)
ALK PHOS: 75 IU/L (ref 39–117)
ALT: 15 IU/L (ref 0–32)
AST: 17 IU/L (ref 0–40)
Albumin/Globulin Ratio: 2.3 — ABNORMAL HIGH (ref 1.2–2.2)
BUN / CREAT RATIO: 17 (ref 12–28)
BUN: 12 mg/dL (ref 8–27)
Bilirubin Total: 0.4 mg/dL (ref 0.0–1.2)
CO2: 24 mmol/L (ref 20–29)
CREATININE: 0.71 mg/dL (ref 0.57–1.00)
Calcium: 9.7 mg/dL (ref 8.7–10.3)
Chloride: 102 mmol/L (ref 96–106)
GFR calc Af Amer: 100 mL/min/{1.73_m2} (ref >59)
GFR calc non Af Amer: 87 mL/min/{1.73_m2} (ref >59)
GLOBULIN, TOTAL: 2 g/dL (ref 1.5–4.5)
GLUCOSE: 96 mg/dL (ref 65–99)
Potassium: 5.6 mmol/L — ABNORMAL HIGH (ref 3.5–5.2)
SODIUM: 142 mmol/L (ref 134–144)
TOTAL PROTEIN: 6.5 g/dL (ref 6.0–8.5)

## 2017-02-21 LAB — LIPID PANEL
CHOL/HDL RATIO: 2.3 ratio (ref 0.0–4.4)
CHOLESTEROL TOTAL: 197 mg/dL (ref 100–199)
HDL: 86 mg/dL (ref >39)
LDL Calculated: 94 mg/dL (ref 0–99)
Triglycerides: 85 mg/dL (ref 0–149)
VLDL CHOLESTEROL CAL: 17 mg/dL (ref 5–40)

## 2017-02-21 LAB — CBC WITH DIFFERENTIAL/PLATELET
BASOS ABS: 0.1 10*3/uL (ref 0.0–0.2)
Basos: 2 %
EOS (ABSOLUTE): 0.2 10*3/uL (ref 0.0–0.4)
EOS: 3 %
HEMATOCRIT: 40.8 % (ref 34.0–46.6)
HEMOGLOBIN: 13.5 g/dL (ref 11.1–15.9)
IMMATURE GRANS (ABS): 0 10*3/uL (ref 0.0–0.1)
IMMATURE GRANULOCYTES: 0 %
LYMPHS ABS: 1.6 10*3/uL (ref 0.7–3.1)
Lymphs: 35 %
MCH: 29.7 pg (ref 26.6–33.0)
MCHC: 33.1 g/dL (ref 31.5–35.7)
MCV: 90 fL (ref 79–97)
MONOCYTES: 8 %
Monocytes Absolute: 0.4 10*3/uL (ref 0.1–0.9)
NEUTROS PCT: 52 %
Neutrophils Absolute: 2.4 10*3/uL (ref 1.4–7.0)
Platelets: 282 10*3/uL (ref 150–379)
RBC: 4.54 x10E6/uL (ref 3.77–5.28)
RDW: 14.2 % (ref 12.3–15.4)
WBC: 4.7 10*3/uL (ref 3.4–10.8)

## 2017-02-21 NOTE — Telephone Encounter (Signed)
-----   Message from Margaretann LovelessJennifer M Burnette, [name] sent at 02/21/2017  8:14 AM EST ----- Potassium is elevated. Discontinue any supplements that have potassium in them. Cholesterol is normal, kidney and liver function normal. Sugar normal. Blood count normal. Would recommend rechecking potassium in 4 weeks.

## 2017-02-21 NOTE — Telephone Encounter (Signed)
Patient advised as directed below.  Thanks,  -Prudy Candy 

## 2017-02-25 ENCOUNTER — Ambulatory Visit (INDEPENDENT_AMBULATORY_CARE_PROVIDER_SITE_OTHER): Payer: Medicare Other | Admitting: Physician Assistant

## 2017-02-25 DIAGNOSIS — Z23 Encounter for immunization: Secondary | ICD-10-CM

## 2017-02-25 NOTE — Progress Notes (Signed)
Nurse Visit: Patient is here today for PCV 13 vaccine. Patient received PPSV23 on 02/09/15.

## 2017-03-19 ENCOUNTER — Telehealth: Payer: Self-pay

## 2017-03-19 ENCOUNTER — Ambulatory Visit
Admission: RE | Admit: 2017-03-19 | Discharge: 2017-03-19 | Disposition: A | Discharge status: Home / Self Care | Payer: Medicare Other | Source: Ambulatory Visit | Attending: Physician Assistant | Admitting: Physician Assistant

## 2017-03-19 DIAGNOSIS — Z87891 Personal history of nicotine dependence: Secondary | ICD-10-CM | POA: Diagnosis not present

## 2017-03-19 DIAGNOSIS — Z780 Asymptomatic menopausal state: Secondary | ICD-10-CM | POA: Insufficient documentation

## 2017-03-19 DIAGNOSIS — Z1231 Encounter for screening mammogram for malignant neoplasm of breast: Secondary | ICD-10-CM | POA: Diagnosis not present

## 2017-03-19 DIAGNOSIS — M85852 Other specified disorders of bone density and structure, left thigh: Secondary | ICD-10-CM | POA: Diagnosis not present

## 2017-03-19 DIAGNOSIS — Z1239 Encounter for other screening for malignant neoplasm of breast: Secondary | ICD-10-CM

## 2017-03-19 NOTE — Telephone Encounter (Signed)
-----   Message from Margaretann LovelessJennifer M Burnette, [place]-C sent at 03/19/2017 12:03 PM EST ----- Bone density shows osteopenia. Recommend to be taking 1200mg  calcium daily and 800 IU Vit D3 daily if tolerated. Continue weight bearing exercises such as walking. We can recheck BMD in 2 years. Mammogram has not resulted yet.

## 2017-03-19 NOTE — Telephone Encounter (Signed)
lmtcb

## 2017-03-19 NOTE — Telephone Encounter (Signed)
Pt returned call. Please advise. Thanks TNP °

## 2017-03-19 NOTE — Telephone Encounter (Signed)
Patient advised as directed below.  Thanks,  -Tashina Credit 

## 2017-03-21 ENCOUNTER — Other Ambulatory Visit: Payer: Self-pay | Admitting: *Deleted

## 2017-03-21 ENCOUNTER — Inpatient Hospital Stay
Admission: RE | Admit: 2017-03-21 | Discharge: 2017-03-21 | Disposition: A | Discharge status: Home / Self Care | Payer: Self-pay | Source: Ambulatory Visit | Attending: *Deleted | Admitting: *Deleted

## 2017-03-21 ENCOUNTER — Telehealth: Payer: Self-pay

## 2017-03-21 DIAGNOSIS — Z9289 Personal history of other medical treatment: Secondary | ICD-10-CM

## 2017-03-21 NOTE — Telephone Encounter (Signed)
Patient advised as below.  

## 2017-03-21 NOTE — Telephone Encounter (Signed)
-----   Message from Margaretann LovelessJennifer M Burnette, [name] sent at 03/21/2017  3:31 PM EST ----- Normal mammogram. Repeat screening in one year.

## 2017-06-19 ENCOUNTER — Encounter: Payer: Self-pay | Admitting: Physician Assistant

## 2017-06-19 ENCOUNTER — Ambulatory Visit: Payer: Medicare Other | Admitting: Physician Assistant

## 2017-06-19 VITALS — BP 116/70 | HR 72 | Temp 97.6°F | Resp 16 | Wt 136.0 lb

## 2017-06-19 DIAGNOSIS — L559 Sunburn, unspecified: Secondary | ICD-10-CM | POA: Diagnosis not present

## 2017-06-19 MED ORDER — TRIAMCINOLONE ACETONIDE 0.1 % EX OINT: 1.0000 "application " | TOPICAL_OINTMENT | Freq: Two times a day (BID) | CUTANEOUS | 0 refills | Status: DC

## 2017-06-19 NOTE — Progress Notes (Signed)
       Patient: Vanessa Dominguez Female    DOB: [birth year]   72 y.o.   MRN: [id number] Visit Date: 06/19/2017  Today's Provider: Margaretann Loveless, [name]   Chief Complaint  Patient presents with  . Rash   Subjective:    HPI Patient here today C/O rash on lips x's one week. Patient reports she has been using OTC creams and reports some improvement. Patient denies using any new lotions, soaps, or make up. She feels her lips may have gotten sunburnt while working outside. No other known trigger.     No Known Allergies   Current Outpatient Medications:  .  Calcium Carbonate (CALCIUM 600 PO), Take 1 tablet by mouth daily., Disp: , Rfl:  .  Doxylamine Succinate, Sleep, (EQ SLEEP AID PO), Take 1 tablet by mouth at bedtime as needed (sleep)., Disp: , Rfl:  .  omeprazole (PRILOSEC) 20 MG capsule, TAKE 1 CAPSULE BY MOUTH EVERY DAY, Disp: 30 capsule, Rfl: 5  Review of Systems  Constitutional: Negative.   Respiratory: Negative.   Cardiovascular: Negative.   Skin: Positive for rash.  Neurological: Negative.     Social History   Tobacco Use  . Smoking status: Former Games developer  . Smokeless tobacco: Never Used  Substance Use Topics  . Alcohol use: No   Objective:   BP 116/70 (BP Location: Left Arm, Patient Position: Sitting, Cuff Size: Normal)   Pulse 72   Temp 97.6 F (36.4 C) (Oral)   Resp 16   Wt 136 lb (61.7 kg)   BMI 22.63 kg/m  Vitals:   06/19/17 0846  BP: 116/70  Pulse: 72  Resp: 16  Temp: 97.6 F (36.4 C)  TempSrc: Oral  Weight: 136 lb (61.7 kg)     Physical Exam  Constitutional: She appears well-developed and well-nourished. No distress.  HENT:  Mouth/Throat: Uvula is midline, oropharynx is clear and moist and mucous membranes are normal.    Neck: Normal range of motion. Neck supple. No tracheal deviation present. No thyromegaly present.  Cardiovascular: Normal rate, regular rhythm and normal heart sounds. Exam reveals no gallop and no friction rub.  No  murmur heard. Pulmonary/Chest: Effort normal and breath sounds normal. No respiratory distress. She has no wheezes. She has no rales.  Lymphadenopathy:    She has no cervical adenopathy.  Skin: She is not diaphoretic.  Vitals reviewed.       Assessment & Plan:     1. Sunburn Suspect sunburn to lips with secondary damage from licking lips as she did this often in the room. Will give triamcinolone as below. Advised to use once to twice daily prn and no longer than 7 days. Advised to by chapstick with SPF. Push fluids. She is to call if symptoms do not improve.  - triamcinolone ointment (KENALOG) 0.1 %; Apply 1 application topically 2 (two) times daily.  Dispense: 30 g; Refill: 0       Margaretann Loveless, [name]  [facility]

## 2017-06-19 NOTE — Patient Instructions (Signed)
Triamcinolone ointment twice daily for no more than 14 days  Use SPF on lips when out in sun  Hat can benefit as well

## 2017-09-03 ENCOUNTER — Telehealth: Payer: Self-pay | Admitting: General Surgery

## 2017-09-03 NOTE — Telephone Encounter (Signed)
PT STATES SHE DID THE [place] THROUGH [facility] SO SHE DOES NOT NEED THIS APPT.  SHE WAS SCHEDULED TO COME 09-26-17 TO DISCUSS HAVING A COLONOSCOPY TO F/U AFTER 86YRS.

## 2017-09-26 ENCOUNTER — Ambulatory Visit: Payer: Self-pay | Admitting: General Surgery

## 2017-10-16 ENCOUNTER — Ambulatory Visit (INDEPENDENT_AMBULATORY_CARE_PROVIDER_SITE_OTHER): Payer: Medicare Other | Admitting: Physician Assistant

## 2017-10-16 DIAGNOSIS — Z23 Encounter for immunization: Secondary | ICD-10-CM

## 2017-10-16 NOTE — Progress Notes (Signed)
Patient was here for shingles (shingrix) vaccine #1 and flu vaccines.  She was given both without complications. She will return in 2 months for shingrix #2. I was available for questions if needed.  No E/M required

## 2017-10-17 LAB — FECAL OCCULT BLOOD, IMMUNOCHEMICAL: IMMUNOLOGICAL FECAL OCCULT BLOOD TEST: NEGATIVE

## 2017-11-12 ENCOUNTER — Encounter: Payer: Self-pay | Admitting: Physician Assistant

## 2017-12-16 ENCOUNTER — Encounter: Payer: Self-pay | Admitting: Physician Assistant

## 2017-12-16 ENCOUNTER — Ambulatory Visit (INDEPENDENT_AMBULATORY_CARE_PROVIDER_SITE_OTHER): Payer: Medicare Other | Admitting: Physician Assistant

## 2017-12-16 VITALS — BP 126/75 | HR 88 | Temp 97.8°F | Wt 123.6 lb

## 2017-12-16 DIAGNOSIS — Z23 Encounter for immunization: Secondary | ICD-10-CM

## 2017-12-16 DIAGNOSIS — F5101 Primary insomnia: Secondary | ICD-10-CM

## 2017-12-16 MED ORDER — RAMELTEON 8 MG PO TABS: 8.0000 mg | ORAL_TABLET | Freq: Every day | ORAL | 1 refills | Status: DC

## 2017-12-16 NOTE — Progress Notes (Signed)
       Patient: Vanessa Dominguez Female    DOB: 1945-04-20   72 y.o.   MRN: [id number] Visit Date: 12/16/2017  Today's Provider: Margaretann Loveless, [name]   Chief Complaint  Patient presents with  . Insomnia   Subjective:    HPI Patient presents today for insomnia about 7 months or more. Patient states she walks 2 miles every morning and still can not sleep. Patient has taking Benadryl and Unisom to help try to sleep and it has not worked. She is sleeping 4-5 hours per night. She is helping to raise her grandson because her daughter is a traveling Engineer, civil (consulting). She has been noticing due to lack of sleep she is dosing more as well in the afternoons (only for about 10 minutes).     No Known Allergies   Current Outpatient Medications:  .  Calcium Carbonate (CALCIUM 600 PO), Take 1 tablet by mouth daily., Disp: , Rfl:  .  Doxylamine Succinate, Sleep, (EQ SLEEP AID PO), Take 1 tablet by mouth at bedtime as needed (sleep)., Disp: , Rfl:  .  omeprazole (PRILOSEC) 20 MG capsule, TAKE 1 CAPSULE BY MOUTH EVERY DAY, Disp: 30 capsule, Rfl: 5 .  triamcinolone ointment (KENALOG) 0.1 %, Apply 1 application topically 2 (two) times daily., Disp: 30 g, Rfl: 0  Review of Systems  Constitutional: Positive for fatigue.  HENT: Negative.   Respiratory: Negative.   Gastrointestinal: Negative.   Musculoskeletal: Negative.   Neurological: Negative.   Psychiatric/Behavioral: Positive for sleep disturbance.    Social History   Tobacco Use  . Smoking status: Former Games developer  . Smokeless tobacco: Never Used  Substance Use Topics  . Alcohol use: No   Objective:   BP 126/75 (BP Location: Left Arm, Patient Position: Sitting, Cuff Size: Normal)   Pulse 88   Temp 97.8 F (36.6 C) (Oral)   Wt 123 lb 9.6 oz (56.1 kg)   SpO2 97%   BMI 20.57 kg/m  Vitals:   12/16/17 0847  BP: 126/75  Pulse: 88  Temp: 97.8 F (36.6 C)  TempSrc: Oral  SpO2: 97%  Weight: 123 lb 9.6 oz (56.1 kg)     Physical Exam    Constitutional: She appears well-developed and well-nourished. No distress.  Neck: Normal range of motion. Neck supple.  Cardiovascular: Normal rate, regular rhythm and normal heart sounds. Exam reveals no gallop and no friction rub.  No murmur heard. Pulmonary/Chest: Effort normal and breath sounds normal. No respiratory distress. She has no wheezes. She has no rales.  Skin: She is not diaphoretic.  Vitals reviewed.      Assessment & Plan:     1. Primary insomnia Failed multiple OTC treatments. Has tried trazodone in the past without success. Will try Rozerem as below. I will see her back in 4-6 weeks to see how she is sleeping.  - ramelteon (ROZEREM) 8 MG tablet; Take 1 tablet (8 mg total) by mouth at bedtime.  Dispense: 30 tablet; Refill: 1  2. Need for shingles vaccine Shingrix #2 Vaccine given to patient without complications. Patient sat for 15 minutes after administration and was tolerated well without adverse effects. - Varicella-zoster vaccine IM (Shingrix)       Margaretann Loveless, [name]  [facility]

## 2017-12-16 NOTE — Patient Instructions (Signed)
Insomnia Insomnia is a sleep disorder that makes it difficult to fall asleep or to stay asleep. Insomnia can cause tiredness (fatigue), low energy, difficulty concentrating, mood swings, and poor performance at work or school. There are three different ways to classify insomnia:  Difficulty falling asleep.  Difficulty staying asleep.  Waking up too early in the morning.  Any type of insomnia can be long-term (chronic) or short-term (acute). Both are common. Short-term insomnia usually lasts for three months or less. Chronic insomnia occurs at least three times a week for longer than three months. What are the causes? Insomnia may be caused by another condition, situation, or substance, such as:  Anxiety.  Certain medicines.  Gastroesophageal reflux disease (GERD) or other gastrointestinal conditions.  Asthma or other breathing conditions.  Restless legs syndrome, sleep apnea, or other sleep disorders.  Chronic pain.  Menopause. This may include hot flashes.  Stroke.  Abuse of alcohol, tobacco, or illegal drugs.  Depression.  Caffeine.  Neurological disorders, such as Alzheimer disease.  An overactive thyroid (hyperthyroidism).  The cause of insomnia may not be known. What increases the risk? Risk factors for insomnia include:  Gender. Women are more commonly affected than men.  Age. Insomnia is more common as you get older.  Stress. This may involve your professional or personal life.  Income. Insomnia is more common in people with lower income.  Lack of exercise.  Irregular work schedule or night shifts.  Traveling between different time zones.  What are the signs or symptoms? If you have insomnia, trouble falling asleep or trouble staying asleep is the main symptom. This may lead to other symptoms, such as:  Feeling fatigued.  Feeling nervous about going to sleep.  Not feeling rested in the morning.  Having trouble concentrating.  Feeling  irritable, anxious, or depressed.  How is this treated? Treatment for insomnia depends on the cause. If your insomnia is caused by an underlying condition, treatment will focus on addressing the condition. Treatment may also include:  Medicines to help you sleep.  Counseling or therapy.  Lifestyle adjustments.  Follow these instructions at home:  Take medicines only as directed by your health care provider.  Keep regular sleeping and waking hours. Avoid naps.  Keep a sleep diary to help you and your health care provider figure out what could be causing your insomnia. Include: ? When you sleep. ? When you wake up during the night. ? How well you sleep. ? How rested you feel the next day. ? Any side effects of medicines you are taking. ? What you eat and drink.  Make your bedroom a comfortable place where it is easy to fall asleep: ? Put up shades or special blackout curtains to block light from outside. ? Use a white noise machine to block noise. ? Keep the temperature cool.  Exercise regularly as directed by your health care provider. Avoid exercising right before bedtime.  Use relaxation techniques to manage stress. Ask your health care provider to suggest some techniques that may work well for you. These may include: ? Breathing exercises. ? Routines to release muscle tension. ? Visualizing peaceful scenes.  Cut back on alcohol, caffeinated beverages, and cigarettes, especially close to bedtime. These can disrupt your sleep.  Do not overeat or eat spicy foods right before bedtime. This can lead to digestive discomfort that can make it hard for you to sleep.  Limit screen use before bedtime. This includes: ? Watching TV. ? Using your smartphone, tablet, and   computer.  Stick to a routine. This can help you fall asleep faster. Try to do a quiet activity, brush your teeth, and go to bed at the same time each night.  Get out of bed if you are still awake after 15 minutes  of trying to sleep. Keep the lights down, but try reading or doing a quiet activity. When you feel sleepy, go back to bed.  Make sure that you drive carefully. Avoid driving if you feel very sleepy.  Keep all follow-up appointments as directed by your health care provider. This is important. Contact a health care provider if:  You are tired throughout the day or have trouble in your daily routine due to sleepiness.  You continue to have sleep problems or your sleep problems get worse. Get help right away if:  You have serious thoughts about hurting yourself or someone else. This information is not intended to replace advice given to you by your health care provider. Make sure you discuss any questions you have with your health care provider. Document Released: 01/20/2000 Document Revised: 06/24/2015 Document Reviewed: 10/23/2013 Elsevier Interactive Patient Education  2018 [employer].  Ramelteon tablets What is this medicine? RAMELTEON (ram EL tee on) is used to treat insomnia. This medicine helps you to fall asleep. This medicine may be used for other purposes; ask your health care provider or pharmacist if you have questions. COMMON BRAND NAME(S): Rozerem What should I tell my health care provider before I take this medicine? They need to know if you have any of these conditions: -depression -history of a drug or alcohol abuse problem -liver disease -lung or breathing disease -suicidal thoughts -an unusual or allergic reaction to ramelteon, other medicines, foods, dyes, or preservatives -pregnant or trying to get pregnant -breast-feeding How should I use this medicine? Take this medicine by mouth with a glass of water. Do not break tablets; swallow whole. Follow the directions on the prescription label. It is better to take this medicine on an empty stomach and only when you are ready for bed. Do not take your medicine more often than directed. A special MedGuide will be given  to you by the pharmacist with each prescription and refill. Be sure to read this information carefully each time. Talk to your pediatrician regarding the use of this medicine in children. Special care may be needed. Overdosage: If you think you have taken too much of this medicine contact a poison control center or emergency room at once. NOTE: This medicine is only for you. Do not share this medicine with others. What if I miss a dose? This does not apply. This medicine should only be taken immediately before going to sleep. Do not take double or extra doses. What may interact with this medicine? Do not take this medicine with any of the following medications: -fluvoxamine -melatonin This medicine may also interact with the following medications: -medicines used to treat fungal infections like ketoconazole, fluconazole, or itraconazole -rifampin This list may not describe all possible interactions. Give your health care provider a list of all the medicines, herbs, non-prescription drugs, or dietary supplements you use. Also tell them if you smoke, drink alcohol, or use illegal drugs. Some items may interact with your medicine. What should I watch for while using this medicine? Visit your doctor or health care professional for regular checks on your progress. Keep a regular sleep schedule by going to bed at about the same time each night. Avoid caffeine-containing drinks in the evening hours.  Talk to your doctor if you still have trouble sleeping within 7 to 10 days of using this medicine. This may mean there is another cause for your sleep problems. After taking this medicine for sleep, you may get up out of bed while not being fully awake and do an activity that you do not know you are doing. The next morning, you may have no memory of the event. Activities such as driving a car ("sleep-driving"), making and eating food, talking on the phone, sexual activity, and sleep-walking have been reported.  Call your doctor right away if you find out you have done any of these activities. Do not take this medicine if you drink alcohol or have taken another medicine for sleep, since your risk of doing these sleep-related activities will be increased. Do not take this medicine unless you are able to stay in bed for a full night (7 to 8 hours) before you must be active again. You may have a decrease in mental alertness the day after use, even if you feel that you are fully awake. Tell your doctor if you will need to perform activities requiring full alertness, such as driving, the next day. Do not stand or sit up quickly after taking this medicine, especially if you are an older patient. This reduces the risk of dizzy or fainting spells. If you or your family notice any changes in your behavior, such as new or worsening depression, thoughts of harming yourself, anxiety, other unusual or disturbing thoughts, or memory loss, call your doctor right away. What side effects may I notice from receiving this medicine? Side effects that you should report to your doctor or health care professional as soon as possible: -allergic reactions like skin rash, itching or hives, swelling of the face, lips, or tongue -breast milk production or discharge -breathing problems -joint or muscle pain -depression, suicidal thoughts -missed monthly period (for women) -unusual activities while asleep like driving, eating, making phone calls -unusually weak or tired -worsening of insomnia Side effects that usually do not require medical attention (report to your doctor or health care professional if they continue or are bothersome): -bad taste -daytime sleepiness -decreased sex drive -diarrhea -headache -nausea This list may not describe all possible side effects. Call your doctor for medical advice about side effects. You may report side effects to FDA at 1-800-FDA-1088. Where should I keep my medicine? Keep out of the reach  of children. Store at room temperature between 15 and 30 degrees C (59 and 86 degrees F). Keep the container tightly closed. Protect from moisture and humidity. Throw away any unused medicine after the expiration date. NOTE: This sheet is a summary. It may not cover all possible information. If you have questions about this medicine, talk to your doctor, pharmacist, or health care provider.  2018 Elsevier/Gold Standard (2013-09-22 18:21:29)

## 2018-01-13 ENCOUNTER — Ambulatory Visit: Payer: Self-pay | Admitting: Physician Assistant

## 2018-02-04 ENCOUNTER — Other Ambulatory Visit: Payer: Self-pay | Admitting: Physician Assistant

## 2018-02-04 DIAGNOSIS — K219 Gastro-esophageal reflux disease without esophagitis: Secondary | ICD-10-CM

## 2018-02-12 ENCOUNTER — Other Ambulatory Visit: Payer: Self-pay | Admitting: Physician Assistant

## 2018-02-12 DIAGNOSIS — Z1231 Encounter for screening mammogram for malignant neoplasm of breast: Secondary | ICD-10-CM

## 2018-03-20 ENCOUNTER — Ambulatory Visit
Admission: RE | Admit: 2018-03-20 | Discharge: 2018-03-20 | Disposition: A | Discharge status: Home / Self Care | Payer: Medicare Other | Source: Ambulatory Visit | Attending: Physician Assistant | Admitting: Physician Assistant

## 2018-03-20 DIAGNOSIS — Z1231 Encounter for screening mammogram for malignant neoplasm of breast: Secondary | ICD-10-CM

## 2018-03-21 ENCOUNTER — Telehealth: Payer: Self-pay

## 2018-03-21 NOTE — Telephone Encounter (Signed)
-----   Message from Margaretann Loveless, [place] sent at 03/20/2018  2:50 PM EST ----- Normal mammogram. Repeat screening in one year.

## 2018-03-21 NOTE — Telephone Encounter (Signed)
Patient was advised.  

## 2018-06-04 ENCOUNTER — Other Ambulatory Visit: Payer: Self-pay | Admitting: Family Medicine

## 2018-06-04 DIAGNOSIS — K219 Gastro-esophageal reflux disease without esophagitis: Secondary | ICD-10-CM

## 2018-09-04 ENCOUNTER — Other Ambulatory Visit: Payer: Self-pay

## 2018-09-04 ENCOUNTER — Encounter: Payer: Self-pay | Admitting: Physician Assistant

## 2018-09-04 ENCOUNTER — Ambulatory Visit (INDEPENDENT_AMBULATORY_CARE_PROVIDER_SITE_OTHER): Payer: Medicare Other | Admitting: Physician Assistant

## 2018-09-04 VITALS — BP 118/70 | HR 94 | Temp 97.9°F | Wt 125.2 lb

## 2018-09-04 DIAGNOSIS — E875 Hyperkalemia: Secondary | ICD-10-CM | POA: Diagnosis not present

## 2018-09-04 DIAGNOSIS — E7800 Pure hypercholesterolemia, unspecified: Secondary | ICD-10-CM | POA: Diagnosis not present

## 2018-09-04 DIAGNOSIS — Z0000 Encounter for general adult medical examination without abnormal findings: Secondary | ICD-10-CM

## 2018-09-04 DIAGNOSIS — K219 Gastro-esophageal reflux disease without esophagitis: Secondary | ICD-10-CM | POA: Diagnosis not present

## 2018-09-04 MED ORDER — OMEPRAZOLE 20 MG PO CPDR: 20.0000 mg | DELAYED_RELEASE_CAPSULE | Freq: Every day | ORAL | 5 refills | Status: DC

## 2018-09-04 NOTE — Patient Instructions (Signed)
Health Maintenance After Age 71 After age 71, you are at a higher risk for certain long-term diseases and infections as well as injuries from falls. Falls are a major cause of broken bones and head injuries in people who are older than age 71. Getting regular preventive care can help to keep you healthy and well. Preventive care includes getting regular testing and making lifestyle changes as recommended by your health care provider. Talk with your health care provider about:  Which screenings and tests you should have. A screening is a test that checks for a disease when you have no symptoms.  A diet and exercise plan that is right for you. What should I know about screenings and tests to prevent falls? Screening and testing are the best ways to find a health problem early. Early diagnosis and treatment give you the best chance of managing medical conditions that are common after age 71. Certain conditions and lifestyle choices may make you more likely to have a fall. Your health care provider may recommend:  Regular vision checks. Poor vision and conditions such as cataracts can make you more likely to have a fall. If you wear glasses, make sure to get your prescription updated if your vision changes.  Medicine review. Work with your health care provider to regularly review all of the medicines you are taking, including over-the-counter medicines. Ask your health care provider about any side effects that may make you more likely to have a fall. Tell your health care provider if any medicines that you take make you feel dizzy or sleepy.  Osteoporosis screening. Osteoporosis is a condition that causes the bones to get weaker. This can make the bones weak and cause them to break more easily.  Blood pressure screening. Blood pressure changes and medicines to control blood pressure can make you feel dizzy.  Strength and balance checks. Your health care provider may recommend certain tests to check your  strength and balance while standing, walking, or changing positions.  Foot health exam. Foot pain and numbness, as well as not wearing proper footwear, can make you more likely to have a fall.  Depression screening. You may be more likely to have a fall if you have a fear of falling, feel emotionally low, or feel unable to do activities that you used to do.  Alcohol use screening. Using too much alcohol can affect your balance and may make you more likely to have a fall. What actions can I take to lower my risk of falls? General instructions  Talk with your health care provider about your risks for falling. Tell your health care provider if: ? You fall. Be sure to tell your health care provider about all falls, even ones that seem minor. ? You feel dizzy, sleepy, or off-balance.  Take over-the-counter and prescription medicines only as told by your health care provider. These include any supplements.  Eat a healthy diet and maintain a healthy weight. A healthy diet includes low-fat dairy products, low-fat (lean) meats, and fiber from whole grains, beans, and lots of fruits and vegetables. Home safety  Remove any tripping hazards, such as rugs, cords, and clutter.  Install safety equipment such as grab bars in bathrooms and safety rails on stairs.  Keep rooms and walkways well-lit. Activity   Follow a regular exercise program to stay fit. This will help you maintain your balance. Ask your health care provider what types of exercise are appropriate for you.  If you need a cane or   walker, use it as recommended by your health care provider.  Wear supportive shoes that have nonskid soles. Lifestyle  Do not drink alcohol if your health care provider tells you not to drink.  If you drink alcohol, limit how much you have: ? 0-1 drink a day for women. ? 0-2 drinks a day for men.  Be aware of how much alcohol is in your drink. In the [place] one drink equals one typical bottle of beer (12  oz), one-half glass of wine (5 oz), or one shot of hard liquor (1 oz).  Do not use any products that contain nicotine or tobacco, such as cigarettes and e-cigarettes. If you need help quitting, ask your health care provider. Summary  Having a healthy lifestyle and getting preventive care can help to protect your health and wellness after age 71.  Screening and testing are the best way to find a health problem early and help you avoid having a fall. Early diagnosis and treatment give you the best chance for managing medical conditions that are more common for people who are older than age 71.  Falls are a major cause of broken bones and head injuries in people who are older than age 71. Take precautions to prevent a fall at home.  Work with your health care provider to learn what changes you can make to improve your health and wellness and to prevent falls. This information is not intended to replace advice given to you by your health care provider. Make sure you discuss any questions you have with your health care provider. Document Released: 12/05/2016 Document Revised: 05/15/2018 Document Reviewed: 12/05/2016 Elsevier Patient Education  2020 Elsevier Inc.  

## 2018-09-04 NOTE — Progress Notes (Signed)
Patient: Vanessa Dominguez, Female    DOB: Dec 30, 1945, 73 y.o.   MRN: [id number] Visit Date: 09/04/2018  Today's Provider: Margaretann LovelessJennifer M Ivannah Zody, [name]   Chief Complaint  Patient presents with  . Medicare Wellness   Subjective:     Annual wellness visit Vanessa Dominguez is a 73 y.o. female. She feels well.  She reports exercising by walking 3 to 4 miles a day.  She reports she is sleeping well, but waking up to go the bathroom. -----------------------------------------------------------   Review of Systems  Constitutional: Positive for fatigue.  HENT: Negative.   Eyes: Negative.   Respiratory: Negative.   Cardiovascular: Negative.   Gastrointestinal: Negative.   Endocrine: Negative.   Genitourinary: Negative.   Musculoskeletal: Negative.   Skin: Negative.   Allergic/Immunologic: Negative.   Neurological: Negative.   Hematological: Negative.   Psychiatric/Behavioral: Positive for sleep disturbance.    Social History   Socioeconomic History  . Marital status: Married    Spouse name: Not on file  . Number of children: Not on file  . Years of education: Not on file  . Highest education level: Not on file  Occupational History  . Not on file  Social Needs  . Financial resource strain: Not on file  . Food insecurity    Worry: Not on file    Inability: Not on file  . Transportation needs    Medical: Not on file    Non-medical: Not on file  Tobacco Use  . Smoking status: Former Games developermoker  . Smokeless tobacco: Never Used  Substance and Sexual Activity  . Alcohol use: No  . Drug use: No  . Sexual activity: Not on file  Lifestyle  . Physical activity    Days per week: Not on file    Minutes per session: Not on file  . Stress: Not on file  Relationships  . Social Musicianconnections    Talks on phone: Not on file    Gets together: Not on file    Attends religious service: Not on file    Active member of club or organization: Not on file    Attends meetings of clubs  or organizations: Not on file    Relationship status: Not on file  . Intimate partner violence    Fear of current or ex partner: Not on file    Emotionally abused: Not on file    Physically abused: Not on file    Forced sexual activity: Not on file  Other Topics Concern  . Not on file  Social History Narrative  . Not on file    Past Medical History:  Diagnosis Date  . Arthritis   . Pneumonia      Patient Active Problem List   Diagnosis Date Noted  . Pure hypercholesterolemia 02/19/2017  . Hyperkalemia 02/19/2017    Past Surgical History:  Procedure Laterality Date  . CATARACT EXTRACTION W/PHACO Left 12/13/2015   Procedure: CATARACT EXTRACTION PHACO AND INTRAOCULAR LENS PLACEMENT (IOC);  Surgeon: Galen ManilaWilliam Porfilio, MD;  Location: [facility] ORS;  Service: Ophthalmology;  Laterality: Left;  Lot# [id number] H US:00:36.0 AP%: 23.7 CDE: 8.52  . CATARACT EXTRACTION W/PHACO Right 01/10/2016   Procedure: CATARACT EXTRACTION PHACO AND INTRAOCULAR LENS PLACEMENT (IOC);  Surgeon: Galen ManilaWilliam Porfilio, MD;  Location: [facility] ORS;  Service: Ophthalmology;  Laterality: Right;  Lot # [id number] H US: 00:38.4 AP%: 21.3 CDE: 8.17  . CESAREAN SECTION    . JOINT REPLACEMENT Left 2006   Lt knee  . KNEE  SURGERY Left [phone],1996  . TUBAL LIGATION  1989    Her family history includes Heart attack in her brother. There is no history of Breast cancer.   Current Outpatient Medications:  .  Calcium Carbonate (CALCIUM 600 PO), Take 1 tablet by mouth daily., Disp: , Rfl:  .  omeprazole (PRILOSEC) 20 MG capsule, TAKE 1 CAPSULE BY MOUTH ONCE DAILY, Disp: 30 capsule, Rfl: 5 .  ramelteon (ROZEREM) 8 MG tablet, Take 1 tablet (8 mg total) by mouth at bedtime. (Patient not taking: Reported on 09/04/2018), Disp: 30 tablet, Rfl: 1 .  triamcinolone ointment (KENALOG) 0.1 %, Apply 1 application topically 2 (two) times daily. (Patient not taking: Reported on 09/04/2018), Disp: 30 g, Rfl: 0  Patient Care Team: Vanessa LovelessBurnette,  Hatim Homann M, [name] as PCP - General (Family Medicine)    Objective:    Vitals: BP 118/70 (BP Location: Left Arm, Patient Position: Sitting, Cuff Size: Normal)   Pulse 94   Temp 97.9 F (36.6 C) (Oral)   Wt 125 lb 3.2 oz (56.8 kg)   SpO2 97%   BMI 20.83 kg/m   Physical Exam Vitals signs reviewed.  Constitutional:      General: She is not in acute distress.    Appearance: Normal appearance. She is well-developed and normal weight. She is not ill-appearing or diaphoretic.  HENT:     Head: Normocephalic and atraumatic.     Right Ear: Tympanic membrane, ear canal and external ear normal.     Left Ear: Tympanic membrane, ear canal and external ear normal.     Nose: Nose normal.     Mouth/Throat:     Mouth: Mucous membranes are moist.     Pharynx: Oropharynx is clear. No oropharyngeal exudate or posterior oropharyngeal erythema.  Eyes:     General: No scleral icterus.       Right eye: No discharge.        Left eye: No discharge.     Extraocular Movements: Extraocular movements intact.     Conjunctiva/sclera: Conjunctivae normal.     Pupils: Pupils are equal, round, and reactive to light.  Neck:     Musculoskeletal: Normal range of motion and neck supple.     Thyroid: No thyromegaly.     Vascular: No carotid bruit or JVD.     Trachea: No tracheal deviation.  Cardiovascular:     Rate and Rhythm: Normal rate and regular rhythm.     Pulses: Normal pulses.     Heart sounds: Normal heart sounds. No murmur. No friction rub. No gallop.   Pulmonary:     Effort: Pulmonary effort is normal. No respiratory distress.     Breath sounds: Normal breath sounds. No wheezing or rales.  Chest:     Chest wall: No tenderness.  Abdominal:     General: Abdomen is flat. Bowel sounds are normal. There is no distension.     Palpations: Abdomen is soft. There is no mass.     Tenderness: There is no abdominal tenderness. There is no guarding or rebound.  Musculoskeletal: Normal range of motion.         General: No tenderness.     Right lower leg: No edema.     Left lower leg: No edema.  Lymphadenopathy:     Cervical: No cervical adenopathy.  Skin:    General: Skin is warm and dry.     Capillary Refill: Capillary refill takes less than 2 seconds.     Findings: No rash.  Neurological:  General: No focal deficit present.     Mental Status: She is alert and oriented to person, place, and time. Mental status is at baseline.     Cranial Nerves: No cranial nerve deficit.     Motor: No weakness.     Gait: Gait normal.  Psychiatric:        Mood and Affect: Mood normal.        Behavior: Behavior normal.        Thought Content: Thought content normal.        Judgment: Judgment normal.     Activities of Daily Living In your present state of health, do you have any difficulty performing the following activities: 09/04/2018  Hearing? N  Vision? N  Difficulty concentrating or making decisions? N  Walking or climbing stairs? N  Dressing or bathing? N  Doing errands, shopping? N  Some recent data might be hidden    Fall Risk Assessment Fall Risk  09/04/2018 02/19/2017 03/15/2016  Falls in the past year? 1 No Yes  Number falls in past yr: 0 - 1  Injury with Fall? 0 - No     Depression Screen PHQ 2/9 Scores 09/04/2018 02/19/2017 03/15/2016  PHQ - 2 Score 0 0 0    6CIT Screen 09/04/2018  What Year? 0 points  What month? 0 points  What time? 0 points  Count back from 20 0 points  Months in reverse 0 points  Repeat phrase 0 points  Total Score 0      Assessment & Plan:     Annual Wellness Visit  Reviewed patient's Family Medical History Reviewed and updated list of patient's medical providers Assessment of cognitive impairment was done Assessed patient's functional ability Established a written schedule for health screening [place] Completed and Reviewed  Exercise Activities and Dietary recommendations Goals   None     Immunization History   Administered Date(s) Administered  . Influenza, High Dose Seasonal PF 03/15/2016, 10/16/2017  . Influenza,inj,Quad PF,6+ Mos 02/09/2015  . Influenza-Unspecified 11/09/2016  . Pneumococcal Conjugate-13 02/25/2017  . Pneumococcal Polysaccharide-23 02/09/2015  . Tdap 07/02/2017  . Zoster 07/30/2012  . Zoster Recombinat (Shingrix) 10/16/2017, 12/16/2017    Health Maintenance  Topic Date Due  . COLONOSCOPY  11/27/1997  . INFLUENZA VACCINE  09/06/2018  . COLON CANCER SCREENING ANNUAL FOBT  10/18/2018  . MAMMOGRAM  03/20/2020  . TETANUS/TDAP  07/03/2027  . DEXA SCAN  Completed  . Hepatitis C Screening  Completed  . PNA vac Low Risk Adult  Completed     Discussed health benefits of physical activity, and encouraged her to engage in regular exercise appropriate for her age and condition.    1. Medicare annual wellness visit, subsequent Normal exam. Up to date on vaccinations and screenings. Will have FIT testing in the fall for colon cancer screen.   2. Hyperkalemia H/O this but recently been normal. Will check labs as below and f/u pending results. - CBC w/Diff/Platelet - Comprehensive Metabolic Panel (CMET) - Lipid Profile  3. Pure hypercholesterolemia Diet controlled. Will check labs as below and f/u pending results. - CBC w/Diff/Platelet - Comprehensive Metabolic Panel (CMET) - Lipid Profile d ------------------------------------------------------------------------------------------------------------    Mar Daring, [name]  [facility]

## 2018-09-05 ENCOUNTER — Telehealth: Payer: Self-pay

## 2018-09-05 LAB — COMPREHENSIVE METABOLIC PANEL
ALT: 14 IU/L (ref 0–32)
AST: 18 IU/L (ref 0–40)
Albumin/Globulin Ratio: 2.5 — ABNORMAL HIGH (ref 1.2–2.2)
Albumin: 4.9 g/dL — ABNORMAL HIGH (ref 3.8–4.8)
Alkaline Phosphatase: 101 IU/L (ref 39–117)
BUN/Creatinine Ratio: 20 (ref 12–28)
BUN: 16 mg/dL (ref 8–27)
Bilirubin Total: 0.4 mg/dL (ref 0.0–1.2)
CO2: 24 mmol/L (ref 20–29)
Calcium: 9.9 mg/dL (ref 8.7–10.3)
Chloride: 101 mmol/L (ref 96–106)
Creatinine, Ser: 0.81 mg/dL (ref 0.57–1.00)
GFR calc Af Amer: 85 mL/min/{1.73_m2} (ref >59)
GFR calc non Af Amer: 74 mL/min/{1.73_m2} (ref >59)
Globulin, Total: 2 g/dL (ref 1.5–4.5)
Glucose: 86 mg/dL (ref 65–99)
Potassium: 5.2 mmol/L (ref 3.5–5.2)
Sodium: 143 mmol/L (ref 134–144)
Total Protein: 6.9 g/dL (ref 6.0–8.5)

## 2018-09-05 LAB — LIPID PANEL
Chol/HDL Ratio: 2 ratio (ref 0.0–4.4)
Cholesterol, Total: 204 mg/dL — ABNORMAL HIGH (ref 100–199)
HDL: 101 mg/dL (ref >39)
LDL Calculated: 87 mg/dL (ref 0–99)
Triglycerides: 79 mg/dL (ref 0–149)
VLDL Cholesterol Cal: 16 mg/dL (ref 5–40)

## 2018-09-05 LAB — CBC WITH DIFFERENTIAL/PLATELET
Basophils Absolute: 0.1 10*3/uL (ref 0.0–0.2)
Basos: 1 %
EOS (ABSOLUTE): 0 10*3/uL (ref 0.0–0.4)
Eos: 0 %
Hematocrit: 42.2 % (ref 34.0–46.6)
Hemoglobin: 13.8 g/dL (ref 11.1–15.9)
Immature Grans (Abs): 0 10*3/uL (ref 0.0–0.1)
Immature Granulocytes: 0 %
Lymphocytes Absolute: 1.2 10*3/uL (ref 0.7–3.1)
Lymphs: 15 %
MCH: 29.9 pg (ref 26.6–33.0)
MCHC: 32.7 g/dL (ref 31.5–35.7)
MCV: 92 fL (ref 79–97)
Monocytes Absolute: 0.5 10*3/uL (ref 0.1–0.9)
Monocytes: 6 %
Neutrophils Absolute: 5.9 10*3/uL (ref 1.4–7.0)
Neutrophils: 78 %
Platelets: 321 10*3/uL (ref 150–450)
RBC: 4.61 x10E6/uL (ref 3.77–5.28)
RDW: 13.3 % (ref 11.7–15.4)
WBC: 7.6 10*3/uL (ref 3.4–10.8)

## 2018-09-05 NOTE — Telephone Encounter (Signed)
Patient advised as directed below. 

## 2018-09-05 NOTE — Telephone Encounter (Signed)
-----   Message from Jennifer M Burnette, [name] sent at 09/05/2018  8:29 AM EDT ----- All labs are within normal limits and stable.  Thanks! -JB 

## 2019-01-11 ENCOUNTER — Encounter: Payer: Self-pay | Admitting: Physician Assistant

## 2019-03-17 ENCOUNTER — Telehealth: Payer: Self-pay

## 2019-03-17 NOTE — Telephone Encounter (Signed)
We received the lab report for FIT(fecal immunochemical Test ) and the reports states "the sample could not be processed." Per provider we can do OC lite in office whenever patient comes in for office visit or hew physical. Ok for the [facility] to give message if patient's calls back.

## 2019-06-26 NOTE — Progress Notes (Signed)
Established patient visit   Patient: Vanessa Dominguez   DOB: 01-17-1946   74 y.o. Female  MRN: [id number] Visit Date: 06/29/2019  Today's healthcare provider: Mar Daring, [name]   Chief Complaint  Patient presents with  . Referral   Subjective    HPI Patient here to be refer to a neuro for pinched nerve. Reports that from just above her knees down to her feet they feel asleep. Left is worse than right. Does not describe as a pins and needles sensation, but states it feels as if they are numb and asleep. If she is up and moving they are ok. Denies any back pain. Has been seeing chiropractor x 3 months without relief. Reports he obtained an xray and told her she had a pinched nerve. Has been seeing chiropractor once per week. Has been doing stretches and exercises in between. Uses ice and heat prn.   Patient Active Problem List   Diagnosis Date Noted  . Pure hypercholesterolemia 02/19/2017  . Hyperkalemia 02/19/2017   Past Medical History:  Diagnosis Date  . Arthritis   . Pneumonia        Medications: Outpatient Medications Prior to Visit  Medication Sig  . Calcium Carbonate (CALCIUM 600 PO) Take 1 tablet by mouth daily.  Marland Kitchen omeprazole (PRILOSEC) 20 MG capsule Take 1 capsule (20 mg total) by mouth daily.  Marland Kitchen triamcinolone ointment (KENALOG) 0.1 % Apply 1 application topically 2 (two) times daily. (Patient not taking: Reported on 09/04/2018)   No facility-administered medications prior to visit.    Review of Systems  Constitutional: Negative for appetite change, chills, fatigue and fever.  Respiratory: Negative for chest tightness and shortness of breath.   Cardiovascular: Negative for chest pain, palpitations and leg swelling.  Gastrointestinal: Negative for abdominal pain, nausea and vomiting.  Neurological: Negative for dizziness and weakness.    Last CBC Lab Results  Component Value Date   WBC 7.6 09/04/2018   HGB 13.8 09/04/2018   HCT 42.2 09/04/2018   MCV 92 09/04/2018   MCH 29.9 09/04/2018   RDW 13.3 09/04/2018   PLT 321 18/84/1660   Last metabolic panel Lab Results  Component Value Date   GLUCOSE 86 09/04/2018   NA 143 09/04/2018   K 5.2 09/04/2018   CL 101 09/04/2018   CO2 24 09/04/2018   BUN 16 09/04/2018   CREATININE 0.81 09/04/2018   GFRNONAA 74 09/04/2018   GFRAA 85 09/04/2018   CALCIUM 9.9 09/04/2018   PROT 6.9 09/04/2018   ALBUMIN 4.9 (H) 09/04/2018   LABGLOB 2.0 09/04/2018   AGRATIO 2.5 (H) 09/04/2018   BILITOT 0.4 09/04/2018   ALKPHOS 101 09/04/2018   AST 18 09/04/2018   ALT 14 09/04/2018   ANIONGAP 8 12/26/2015      Objective    BP 135/86 (BP Location: Left Arm, Patient Position: Sitting, Cuff Size: Normal)   Pulse 87   Temp (!) 96.9 F (36.1 C) (Temporal)   Resp 16   Ht 5\' 7"  (1.702 m)   Wt 128 lb (58.1 kg)   BMI 20.05 kg/m  BP Readings from Last 3 Encounters:  06/29/19 135/86  09/04/18 118/70  12/16/17 126/75   Wt Readings from Last 3 Encounters:  06/29/19 128 lb (58.1 kg)  09/04/18 125 lb 3.2 oz (56.8 kg)  12/16/17 123 lb 9.6 oz (56.1 kg)      Physical Exam Vitals reviewed.  Constitutional:      General: She is not in acute distress.  Appearance: Normal appearance. She is well-developed and normal weight. She is not ill-appearing or diaphoretic.  Neck:     Thyroid: No thyromegaly.     Vascular: No JVD.     Trachea: No tracheal deviation.  Cardiovascular:     Rate and Rhythm: Normal rate and regular rhythm.     Pulses: Normal pulses.     Heart sounds: No murmur. No friction rub. No gallop.   Pulmonary:     Effort: Pulmonary effort is normal. No respiratory distress.     Breath sounds: Normal breath sounds. No wheezing or rales.  Musculoskeletal:     Cervical back: Normal range of motion and neck supple.     Lumbar back: Normal. No spasms or tenderness. Normal range of motion. Negative right straight leg raise test and negative left straight leg raise test.     Right lower leg:  No edema.     Left lower leg: No edema.  Lymphadenopathy:     Cervical: No cervical adenopathy.  Skin:    Capillary Refill: Capillary refill takes less than 2 seconds.  Neurological:     General: No focal deficit present.     Mental Status: She is alert. Mental status is at baseline.     Motor: No weakness.     Gait: Gait normal.       No results found for any visits on 06/29/19.  Assessment & Plan     1. Lumbar radiculopathy Will obtain new xray as below. Start gabapentin for nerve pain as below. Advised may take throughout the day as prescribed or may take all 3 (300mg ) at bedtime. She agrees and voices understanding. She will call if not tolerating or if dose needs to be increased. Referral placed for neurosurgery as below for further treatment considerations. Patient is still very active and healthy and would like more aggressive treatment options. - DG Lumbar Spine Complete; Future - gabapentin (NEURONTIN) 100 MG capsule; Take 1 capsule (100 mg total) by mouth 3 (three) times daily.  Dispense: 90 capsule; Refill: 0 - Ambulatory referral to Neurosurgery   No follow-ups on file.      , [name], have reviewed all documentation for this visit. The documentation on 06/29/19 for the exam, diagnosis, procedures, and orders are all accurate and complete.   07/01/19  [facility] [phone] (phone) [phone] (fax)  [facility]

## 2019-06-29 ENCOUNTER — Other Ambulatory Visit: Payer: Self-pay

## 2019-06-29 ENCOUNTER — Telehealth: Payer: Self-pay

## 2019-06-29 ENCOUNTER — Ambulatory Visit
Admission: RE | Admit: 2019-06-29 | Discharge: 2019-06-29 | Disposition: A | Discharge status: Home / Self Care | Payer: Medicare PPO | Source: Ambulatory Visit | Attending: Physician Assistant | Admitting: Physician Assistant

## 2019-06-29 ENCOUNTER — Encounter: Payer: Self-pay | Admitting: Physician Assistant

## 2019-06-29 ENCOUNTER — Ambulatory Visit
Admission: RE | Admit: 2019-06-29 | Discharge: 2019-06-29 | Disposition: A | Discharge status: Home / Self Care | Payer: Medicare PPO | Attending: Physician Assistant | Admitting: Physician Assistant

## 2019-06-29 ENCOUNTER — Ambulatory Visit: Payer: Medicare PPO | Admitting: Physician Assistant

## 2019-06-29 VITALS — BP 135/86 | HR 87 | Temp 96.9°F | Resp 16 | Ht 67.0 in | Wt 128.0 lb

## 2019-06-29 DIAGNOSIS — M5416 Radiculopathy, lumbar region: Secondary | ICD-10-CM | POA: Insufficient documentation

## 2019-06-29 MED ORDER — GABAPENTIN 100 MG PO CAPS: 100.0000 mg | ORAL_CAPSULE | Freq: Three times a day (TID) | ORAL | 0 refills | Status: DC

## 2019-06-29 NOTE — Patient Instructions (Addendum)
Lumbosacral Radiculopathy Lumbosacral radiculopathy is a condition that involves the spinal nerves and nerve roots in the low back and bottom of the spine. The condition develops when these nerves and nerve roots move out of place or become inflamed and cause symptoms. What are the causes? This condition may be caused by:  Pressure from a disk that bulges out of place (herniated disk). A disk is a plate of soft cartilage that separates bones in the spine.  Disk changes that occur with age (disk degeneration).  A narrowing of the bones of the lower back (spinal stenosis).  A tumor.  An infection.  An injury that places sudden pressure on the disks that cushion the bones of your lower spine. What increases the risk? You are more likely to develop this condition if:  You are a female who is 30-50 years old.  You are a female who is 50-60 years old.  You use improper technique when lifting things.  You are overweight or live a sedentary lifestyle.  Your work requires frequent lifting.  You smoke.  You do repetitive activities that strain the spine. What are the signs or symptoms? Symptoms of this condition include:  Pain that goes down from your back into your legs (sciatica), usually on one side of the body. This is the most common symptom. The pain may be worse with sitting, coughing, or sneezing.  Pain and numbness in your legs.  Muscle weakness.  Tingling.  Loss of bladder control or bowel control. How is this diagnosed? This condition may be diagnosed based on:  Your symptoms and medical history.  A physical exam. If the pain is lasting, you may have tests, such as:  MRI scan.  X-ray.  CT scan.  A type of X-ray used to examine the spinal canal after injecting a dye into your spine (myelogram).  A test to measure how electrical impulses move through a nerve (nerve conduction study). How is this treated? Treatment may depend on the cause of the condition and  may include:  Working with a physical therapist.  Taking pain medicine.  Applying heat and ice to affected areas.  Doing stretches to improve flexibility.  Doing exercises to strengthen back muscles.  Having chiropractic spinal manipulation.  Using transcutaneous electrical nerve stimulation (TENS) therapy.  Getting a steroid injection in the spine. In some cases, no treatment is needed. If the condition is long-lasting (chronic), or if symptoms are severe, treatment may involve surgery or lifestyle changes, such as following a weight-loss plan. Follow these instructions at home: Activity  Avoid bending and other activities that make the problem worse.  Maintain a proper position when standing or sitting: ? When standing, keep your upper back and neck straight, with your shoulders pulled back. Avoid slouching. ? When sitting, keep your back straight and relax your shoulders. Do not round your shoulders or pull them backward.  Do not sit or stand in one place for long periods of time.  Take brief periods of rest throughout the day. This will reduce your pain. It is usually better to rest by lying down or standing, not sitting.  When you are resting for longer periods, mix in some mild activity or stretching between periods of rest. This will help to prevent stiffness and pain.  Get regular exercise. Ask your health care provider what activities are safe for you. If you were shown how to do any exercises or stretches, do them as directed by your health care provider.  Do   not lift anything that is heavier than 10 lb (4.5 kg) or the limit that you are told by your health care provider. Always use proper lifting technique, which includes: ? Bending your knees. ? Keeping the load close to your body. ? Avoiding twisting. Managing pain  If directed, put ice on the affected area: ? Put ice in a plastic bag. ? Place a towel between your skin and the bag. ? Leave the ice on for 20  minutes, 2-3 times a day.  If directed, apply heat to the affected area as often as told by your health care provider. Use the heat source that your health care provider recommends, such as a moist heat pack or a heating pad. ? Place a towel between your skin and the heat source. ? Leave the heat on for 20-30 minutes. ? Remove the heat if your skin turns bright red. This is especially important if you are unable to feel pain, heat, or cold. You may have a greater risk of getting burned.  Take over-the-counter and prescription medicines only as told by your health care provider. General instructions  Sleep on a firm mattress in a comfortable position. Try lying on your side with your knees slightly bent. If you lie on your back, put a pillow under your knees.  Do not drive or use heavy machinery while taking prescription pain medicine.  If your health care provider prescribed a diet or exercise program, follow it as directed.  Keep all follow-up visits as told by your health care provider. This is important. Contact a health care provider if:  Your pain does not improve over time, even when taking pain medicines. Get help right away if:  You develop severe pain.  Your pain suddenly gets worse.  You develop increasing weakness in your legs.  You lose the ability to control your bladder or bowel.  You have difficulty walking or balancing.  You have a fever. Summary  Lumbosacral radiculopathy is a condition that occurs when the spinal nerves and nerve roots in the lower part of the spine move out of place or become inflamed and cause symptoms.  Symptoms include pain, numbness, and tingling that go down from your back into your legs (sciatica), muscle weakness, and loss of bladder control or bowel control.  If directed, apply ice or heat to the affected area as told by your health care provider.  Follow instructions about activity, rest, and proper lifting technique. This  information is not intended to replace advice given to you by your health care provider. Make sure you discuss any questions you have with your health care provider. Document Revised: 01/10/2017 Document Reviewed: 01/10/2017 Elsevier Patient Education  [place].   Back Exercises The following exercises strengthen the muscles that help to support the trunk and back. They also help to keep the lower back flexible. Doing these exercises can help to prevent back pain or lessen existing pain.  If you have back pain or discomfort, try doing these exercises 2-3 times each day or as told by your health care provider.  As your pain improves, do them once each day, but increase the number of times that you repeat the steps for each exercise (do more repetitions).  To prevent the recurrence of back pain, continue to do these exercises once each day or as told by your health care provider. Do exercises exactly as told by your health care provider and adjust them as directed. It is normal to feel  mild stretching, pulling, tightness, or discomfort as you do these exercises, but you should stop right away if you feel sudden pain or your pain gets worse. Exercises Single knee to chest Repeat these steps 3-5 times for each leg: 1. Lie on your back on a firm bed or the floor with your legs extended. 2. Bring one knee to your chest. Your other leg should stay extended and in contact with the floor. 3. Hold your knee in place by grabbing your knee or thigh with both hands and hold. 4. Pull on your knee until you feel a gentle stretch in your lower back or buttocks. 5. Hold the stretch for 10-30 seconds. 6. Slowly release and straighten your leg. Pelvic tilt Repeat these steps 5-10 times: 1. Lie on your back on a firm bed or the floor with your legs extended. 2. Bend your knees so they are pointing toward the ceiling and your feet are flat on the floor. 3. Tighten your lower abdominal muscles to  press your lower back against the floor. This motion will tilt your pelvis so your tailbone points up toward the ceiling instead of pointing to your feet or the floor. 4. With gentle tension and even breathing, hold this position for 5-10 seconds. Cat-cow Repeat these steps until your lower back becomes more flexible: 1. Get into a hands-and-knees position on a firm surface. Keep your hands under your shoulders, and keep your knees under your hips. You may place padding under your knees for comfort. 2. Let your head hang down toward your chest. Contract your abdominal muscles and point your tailbone toward the floor so your lower back becomes rounded like the back of a cat. 3. Hold this position for 5 seconds. 4. Slowly lift your head, let your abdominal muscles relax and point your tailbone up toward the ceiling so your back forms a sagging arch like the back of a cow. 5. Hold this position for 5 seconds.  Press-ups Repeat these steps 5-10 times: 1. Lie on your abdomen (face-down) on the floor. 2. Place your palms near your head, about shoulder-width apart. 3. Keeping your back as relaxed as possible and keeping your hips on the floor, slowly straighten your arms to raise the top half of your body and lift your shoulders. Do not use your back muscles to raise your upper torso. You may adjust the placement of your hands to make yourself more comfortable. 4. Hold this position for 5 seconds while you keep your back relaxed. 5. Slowly return to lying flat on the floor.  Bridges Repeat these steps 10 times: 1. Lie on your back on a firm surface. 2. Bend your knees so they are pointing toward the ceiling and your feet are flat on the floor. Your arms should be flat at your sides, next to your body. 3. Tighten your buttocks muscles and lift your buttocks off the floor until your waist is at almost the same height as your knees. You should feel the muscles working in your buttocks and the back of  your thighs. If you do not feel these muscles, slide your feet 1-2 inches farther away from your buttocks. 4. Hold this position for 3-5 seconds. 5. Slowly lower your hips to the starting position, and allow your buttocks muscles to relax completely. If this exercise is too easy, try doing it with your arms crossed over your chest. Abdominal crunches Repeat these steps 5-10 times: 1. Lie on your back on a firm bed or  the floor with your legs extended. 2. Bend your knees so they are pointing toward the ceiling and your feet are flat on the floor. 3. Cross your arms over your chest. 4. Tip your chin slightly toward your chest without bending your neck. 5. Tighten your abdominal muscles and slowly raise your trunk (torso) high enough to lift your shoulder blades a tiny bit off the floor. Avoid raising your torso higher than that because it can put too much stress on your low back and does not help to strengthen your abdominal muscles. 6. Slowly return to your starting position. Back lifts Repeat these steps 5-10 times: 1. Lie on your abdomen (face-down) with your arms at your sides, and rest your forehead on the floor. 2. Tighten the muscles in your legs and your buttocks. 3. Slowly lift your chest off the floor while you keep your hips pressed to the floor. Keep the back of your head in line with the curve in your back. Your eyes should be looking at the floor. 4. Hold this position for 3-5 seconds. 5. Slowly return to your starting position. Contact a health care provider if:  Your back pain or discomfort gets much worse when you do an exercise.  Your worsening back pain or discomfort does not lessen within 2 hours after you exercise. If you have any of these problems, stop doing these exercises right away. Do not do them again unless your health care provider says that you can. Get help right away if:  You develop sudden, severe back pain. If this happens, stop doing the exercises right  away. Do not do them again unless your health care provider says that you can. This information is not intended to replace advice given to you by your health care provider. Make sure you discuss any questions you have with your health care provider. Document Revised: 05/29/2018 Document Reviewed: 10/24/2017 Elsevier Patient Education  2020 [employer].

## 2019-06-29 NOTE — Telephone Encounter (Signed)
Patient advised as directed below. 

## 2019-06-29 NOTE — Telephone Encounter (Signed)
-----   Message from Margaretann Loveless, [place] sent at 06/29/2019 10:44 AM EDT ----- So there is multi-level degenerative disc disease and facet arthropathy/arthritis noted in the lumbar spine

## 2019-07-08 ENCOUNTER — Telehealth: Payer: Self-pay

## 2019-07-08 NOTE — Telephone Encounter (Signed)
Ok.  She will still have to call the neurosurgeon to let them know she is canceling.

## 2019-07-08 NOTE — Telephone Encounter (Signed)
Patient advised.

## 2019-07-08 NOTE — Telephone Encounter (Signed)
Copied from CRM [id number]. Topic: Referral - Question >> Jul 08, 2019 10:38 AM Daymon Larsen wrote: Reason for CRM: Pt wanted to make office aware she doesn't want the 5/24 neurosurgery referral.

## 2019-07-23 ENCOUNTER — Other Ambulatory Visit: Payer: Self-pay | Admitting: Physician Assistant

## 2019-07-23 DIAGNOSIS — M5416 Radiculopathy, lumbar region: Secondary | ICD-10-CM

## 2019-07-23 MED ORDER — GABAPENTIN 100 MG PO CAPS: 100.0000 mg | ORAL_CAPSULE | Freq: Three times a day (TID) | ORAL | 5 refills | Status: DC

## 2019-07-23 NOTE — Telephone Encounter (Signed)
[facility] faxed refill request for the following medications:   gabapentin (NEURONTIN) 100 MG capsule    Please advise.  

## 2019-09-07 ENCOUNTER — Other Ambulatory Visit: Payer: Self-pay

## 2019-09-07 ENCOUNTER — Encounter: Payer: Self-pay | Admitting: Physician Assistant

## 2019-09-07 ENCOUNTER — Ambulatory Visit (INDEPENDENT_AMBULATORY_CARE_PROVIDER_SITE_OTHER): Payer: Medicare PPO | Admitting: Physician Assistant

## 2019-09-07 VITALS — BP 135/69 | HR 87 | Temp 98.2°F | Resp 16 | Ht 65.0 in | Wt 128.8 lb

## 2019-09-07 DIAGNOSIS — E7800 Pure hypercholesterolemia, unspecified: Secondary | ICD-10-CM

## 2019-09-07 DIAGNOSIS — K219 Gastro-esophageal reflux disease without esophagitis: Secondary | ICD-10-CM | POA: Diagnosis not present

## 2019-09-07 DIAGNOSIS — G629 Polyneuropathy, unspecified: Secondary | ICD-10-CM

## 2019-09-07 DIAGNOSIS — Z0000 Encounter for general adult medical examination without abnormal findings: Secondary | ICD-10-CM

## 2019-09-07 DIAGNOSIS — Z1239 Encounter for other screening for malignant neoplasm of breast: Secondary | ICD-10-CM

## 2019-09-07 MED ORDER — GABAPENTIN 300 MG PO CAPS: 300.0000 mg | ORAL_CAPSULE | Freq: Three times a day (TID) | ORAL | 5 refills | Status: DC

## 2019-09-07 MED ORDER — OMEPRAZOLE 20 MG PO CPDR: 20.0000 mg | DELAYED_RELEASE_CAPSULE | Freq: Every day | ORAL | 1 refills | Status: DC

## 2019-09-07 NOTE — Progress Notes (Signed)
Annual Wellness Visit     Patient: Vanessa Dominguez, Female    DOB: 1945/06/25, 74 y.o.   MRN: [id number] Visit Date: 09/07/2019  Today's Provider: Margaretann LovelessJennifer M Kate Larock, [name]   Chief Complaint  Patient presents with  . Medicare Wellness   Subjective    Vanessa Dominguez is a 74 y.o. female who presents today for her Annual Wellness Visit. She reports consuming a general diet. Home exercise routine includes walks 2 miles a day. She generally feels fairly well. She reports sleeping fairly well. She does not have additional problems to discuss today.   HPI   Patient Active Problem List   Diagnosis Date Noted  . Pure hypercholesterolemia 02/19/2017  . Hyperkalemia 02/19/2017   Past Medical History:  Diagnosis Date  . Arthritis   . Pneumonia        Medications: Outpatient Medications Prior to Visit  Medication Sig  . Calcium Carbonate (CALCIUM 600 PO) Take 1 tablet by mouth daily.  Marland Kitchen. gabapentin (NEURONTIN) 100 MG capsule Take 1 capsule (100 mg total) by mouth 3 (three) times daily.  Marland Kitchen. omeprazole (PRILOSEC) 20 MG capsule Take 1 capsule (20 mg total) by mouth daily.   No facility-administered medications prior to visit.    No Known Allergies  Patient Care Team: Reine JustBurnette, Ravina Milner M, [name] as PCP - General (Family Medicine)  Review of Systems  Constitutional: Positive for fatigue.  HENT: Negative.   Eyes: Negative.   Respiratory: Negative.   Cardiovascular: Negative.   Gastrointestinal: Negative.   Endocrine: Negative.   Genitourinary: Negative.   Musculoskeletal: Negative.   Skin: Negative.   Allergic/Immunologic: Negative.   Neurological: Negative.   Hematological: Bruises/bleeds easily.  Psychiatric/Behavioral: Positive for sleep disturbance.    Last CBC Lab Results  Component Value Date   WBC 7.6 09/04/2018   HGB 13.8 09/04/2018   HCT 42.2 09/04/2018   MCV 92 09/04/2018   MCH 29.9 09/04/2018   RDW 13.3 09/04/2018   PLT 321 09/04/2018   Last  metabolic panel Lab Results  Component Value Date   GLUCOSE 86 09/04/2018   NA 143 09/04/2018   K 5.2 09/04/2018   CL 101 09/04/2018   CO2 24 09/04/2018   BUN 16 09/04/2018   CREATININE 0.81 09/04/2018   GFRNONAA 74 09/04/2018   GFRAA 85 09/04/2018   CALCIUM 9.9 09/04/2018   PROT 6.9 09/04/2018   ALBUMIN 4.9 (H) 09/04/2018   LABGLOB 2.0 09/04/2018   AGRATIO 2.5 (H) 09/04/2018   BILITOT 0.4 09/04/2018   ALKPHOS 101 09/04/2018   AST 18 09/04/2018   ALT 14 09/04/2018   ANIONGAP 8 12/26/2015   Last lipids Lab Results  Component Value Date   CHOL 204 (H) 09/04/2018   HDL 101 09/04/2018   LDLCALC 87 09/04/2018   TRIG 79 09/04/2018   CHOLHDL 2.0 09/04/2018      Objective    Vitals: BP (!) 135/69 (BP Location: Left Arm, Patient Position: Sitting, Cuff Size: Normal)   Pulse 87   Temp 98.2 F (36.8 C) (Oral)   Resp 16   Ht 5\' 5"  (1.651 m)   Wt 128 lb 12.8 oz (58.4 kg)   BMI 21.43 kg/m  BP Readings from Last 3 Encounters:  09/07/19 (!) 135/69  06/29/19 135/86  09/04/18 118/70   Wt Readings from Last 3 Encounters:  09/07/19 128 lb 12.8 oz (58.4 kg)  06/29/19 128 lb (58.1 kg)  09/04/18 125 lb 3.2 oz (56.8 kg)      Physical  Exam Vitals reviewed.  Constitutional:      General: She is not in acute distress.    Appearance: Normal appearance. She is well-developed and normal weight. She is not ill-appearing or diaphoretic.  HENT:     Head: Normocephalic and atraumatic.     Right Ear: Tympanic membrane, ear canal and external ear normal.     Left Ear: Tympanic membrane, ear canal and external ear normal.  Eyes:     General: No scleral icterus.       Right eye: No discharge.        Left eye: No discharge.     Extraocular Movements: Extraocular movements intact.     Conjunctiva/sclera: Conjunctivae normal.     Pupils: Pupils are equal, round, and reactive to light.  Neck:     Thyroid: No thyromegaly.     Vascular: No carotid bruit or JVD.     Trachea: No tracheal  deviation.  Cardiovascular:     Rate and Rhythm: Normal rate and regular rhythm.     Pulses: Normal pulses.     Heart sounds: Normal heart sounds. No murmur heard.  No friction rub. No gallop.   Pulmonary:     Effort: Pulmonary effort is normal. No respiratory distress.     Breath sounds: Normal breath sounds. No wheezing or rales.  Chest:     Chest wall: No tenderness.  Abdominal:     General: Abdomen is flat. Bowel sounds are normal. There is no distension.     Palpations: Abdomen is soft. There is no mass.     Tenderness: There is no abdominal tenderness. There is no guarding or rebound.  Musculoskeletal:        General: No tenderness. Normal range of motion.     Cervical back: Normal range of motion and neck supple.     Right lower leg: No edema.     Left lower leg: No edema.  Lymphadenopathy:     Cervical: No cervical adenopathy.  Skin:    General: Skin is warm and dry.     Capillary Refill: Capillary refill takes less than 2 seconds.     Findings: No rash.  Neurological:     General: No focal deficit present.     Mental Status: She is alert and oriented to person, place, and time. Mental status is at baseline.  Psychiatric:        Mood and Affect: Mood normal.        Behavior: Behavior normal.        Thought Content: Thought content normal.        Judgment: Judgment normal.     Most recent functional status assessment: In your present state of health, do you have any difficulty performing the following activities: 09/07/2019  Hearing? N  Vision? N  Difficulty concentrating or making decisions? Y  Walking or climbing stairs? N  Dressing or bathing? N  Doing errands, shopping? N  Some recent data might be hidden   Most recent fall risk assessment: Fall Risk  09/07/2019  Falls in the past year? 0  Number falls in past yr: 0  Injury with Fall? 0  Risk for fall due to : No Fall Risks  Follow up Falls evaluation completed    Most recent depression screenings: PHQ  2/9 Scores 09/07/2019 09/04/2018  PHQ - 2 Score 1 0   Most recent cognitive screening: 6CIT Screen 09/07/2019  What Year? 0 points  What month? 0 points  What time? 0  points  Count back from 20 0 points  Months in reverse 0 points  Repeat phrase 2 points  Total Score 2   Most recent Audit-C alcohol use screening Alcohol Use Disorder Test (AUDIT) 09/07/2019  1. How often do you have a drink containing alcohol? 0  2. How many drinks containing alcohol do you have on a typical day when you are drinking? 0  3. How often do you have six or more drinks on one occasion? 0  AUDIT-C Score 0  Alcohol Brief Interventions/Follow-up AUDIT Score <7 follow-up not indicated   A score of 3 or more in women, and 4 or more in men indicates increased risk for alcohol abuse, EXCEPT if all of the points are from question 1   No results found for any visits on 09/07/19.  Assessment & Plan     Annual wellness visit done today including the all of the following: Reviewed patient's Family Medical History Reviewed and updated list of patient's medical providers Assessment of cognitive impairment was done Assessed patient's functional ability Established a written schedule for health screening services Health Risk Assessent Completed and Reviewed  Exercise Activities and Dietary recommendations Goals   None     Immunization History  Administered Date(s) Administered  . Influenza, High Dose Seasonal PF 03/15/2016, 10/16/2017  . Influenza,inj,Quad PF,6+ Mos 02/09/2015  . Influenza-Unspecified 11/09/2016, 11/18/2018  . Moderna SARS-COVID-2 Vaccination 04/02/2019, 04/30/2019  . Pneumococcal Conjugate-13 02/25/2017  . Pneumococcal Polysaccharide-23 02/09/2015  . Tdap 07/02/2017  . Zoster 07/30/2012  . Zoster Recombinat (Shingrix) 10/16/2017, 12/16/2017    Health Maintenance  Topic Date Due  . COLONOSCOPY  Never done  . INFLUENZA VACCINE  09/06/2019  . COLON CANCER SCREENING ANNUAL FOBT  01/11/2020    . MAMMOGRAM  03/20/2020  . TETANUS/TDAP  07/03/2027  . DEXA SCAN  Completed  . COVID-19 Vaccine  Completed  . Hepatitis C Screening  Completed  . PNA vac Low Risk Adult  Completed     Discussed health benefits of physical activity, and encouraged her to engage in regular exercise appropriate for her age and condition.    1. Medicare annual wellness visit, subsequent Normal exam. Up to date on screenings and vaccinations.   2. Pure hypercholesterolemia Diet controlled. Will check labs as below and f/u pending results. - CBC with Differential/Platelet - Hemoglobin A1c - Lipid panel  3. Gastroesophageal reflux disease Stable. Diagnosis pulled for medication refill. Continue current medical treatment plan. Will check labs as below and f/u pending results. - CBC with Differential/Platelet - Comprehensive metabolic panel - omeprazole (PRILOSEC) 20 MG capsule; Take 1 capsule (20 mg total) by mouth daily.  Dispense: 90 capsule; Refill: 1  4. Neuropathy Stable. Diagnosis pulled for medication refill. Continue current medical treatment plan. Will check labs as below and f/u pending results. - TSH - gabapentin (NEURONTIN) 300 MG capsule; Take 1 capsule (300 mg total) by mouth 3 (three) times daily.  Dispense: 90 capsule; Refill: 5  5. Encounter for breast cancer screening using non-mammogram modality Breast exam today was normal. There is no family history of breast cancer. She does perform regular self breast exams. Mammogram was ordered as below. Information for [facility] was given to patient so she may schedule her mammogram at her convenience. - MM 3D SCREEN BREAST BILATERAL; Future   No follow-ups on file.     Delmer Islam, [name], have reviewed all documentation for this visit. The documentation on 09/27/19 for the exam, diagnosis, procedures, and  orders are all accurate and complete.   Reine Just  [facility] [phone]  (phone) [phone] (fax)  [facility]

## 2019-09-07 NOTE — Patient Instructions (Signed)
[facility] [address] [place],  [place]  [address] Main: [phone]   Health Maintenance After Age 72 After age 72, you are at a higher risk for certain long-term diseases and infections as well as injuries from falls. Falls are a major cause of broken bones and head injuries in people who are older than age 72. Getting regular preventive care can help to keep you healthy and well. Preventive care includes getting regular testing and making lifestyle changes as recommended by your health care provider. Talk with your health care provider about:  Which screenings and tests you should have. A screening is a test that checks for a disease when you have no symptoms.  A diet and exercise plan that is right for you. What should I know about screenings and tests to prevent falls? Screening and testing are the best ways to find a health problem early. Early diagnosis and treatment give you the best chance of managing medical conditions that are common after age 72. Certain conditions and lifestyle choices may make you more likely to have a fall. Your health care provider may recommend:  Regular vision checks. Poor vision and conditions such as cataracts can make you more likely to have a fall. If you wear glasses, make sure to get your prescription updated if your vision changes.  Medicine review. Work with your health care provider to regularly review all of the medicines you are taking, including over-the-counter medicines. Ask your health care provider about any side effects that may make you more likely to have a fall. Tell your health care provider if any medicines that you take make you feel dizzy or sleepy.  Osteoporosis screening. Osteoporosis is a condition that causes the bones to get weaker. This can make the bones weak and cause them to break more easily.  Blood pressure screening. Blood pressure changes and medicines to control blood pressure can  make you feel dizzy.  Strength and balance checks. Your health care provider may recommend certain tests to check your strength and balance while standing, walking, or changing positions.  Foot health exam. Foot pain and numbness, as well as not wearing proper footwear, can make you more likely to have a fall.  Depression screening. You may be more likely to have a fall if you have a fear of falling, feel emotionally low, or feel unable to do activities that you used to do.  Alcohol use screening. Using too much alcohol can affect your balance and may make you more likely to have a fall. What actions can I take to lower my risk of falls? General instructions  Talk with your health care provider about your risks for falling. Tell your health care provider if: ? You fall. Be sure to tell your health care provider about all falls, even ones that seem minor. ? You feel dizzy, sleepy, or off-balance.  Take over-the-counter and prescription medicines only as told by your health care provider. These include any supplements.  Eat a healthy diet and maintain a healthy weight. A healthy diet includes low-fat dairy products, low-fat (lean) meats, and fiber from whole grains, beans, and lots of fruits and vegetables. Home safety  Remove any tripping hazards, such as rugs, cords, and clutter.  Install safety equipment such as grab bars in bathrooms and safety rails on stairs.  Keep rooms and walkways well-lit. Activity   Follow a regular exercise program to stay fit. This will help you maintain your balance.   Ask your health care provider what types of exercise are appropriate for you.  If you need a cane or walker, use it as recommended by your health care provider.  Wear supportive shoes that have nonskid soles. Lifestyle  Do not drink alcohol if your health care provider tells you not to drink.  If you drink alcohol, limit how much you have: ? 0-1 drink a day for women. ? 0-2 drinks a  day for men.  Be aware of how much alcohol is in your drink. In the [place] one drink equals one typical bottle of beer (12 oz), one-half glass of wine (5 oz), or one shot of hard liquor (1 oz).  Do not use any products that contain nicotine or tobacco, such as cigarettes and e-cigarettes. If you need help quitting, ask your health care provider. Summary  Having a healthy lifestyle and getting preventive care can help to protect your health and wellness after age 72.  Screening and testing are the best way to find a health problem early and help you avoid having a fall. Early diagnosis and treatment give you the best chance for managing medical conditions that are more common for people who are older than age 72.  Falls are a major cause of broken bones and head injuries in people who are older than age 72. Take precautions to prevent a fall at home.  Work with your health care provider to learn what changes you can make to improve your health and wellness and to prevent falls. This information is not intended to replace advice given to you by your health care provider. Make sure you discuss any questions you have with your health care provider. Document Revised: 05/15/2018 Document Reviewed: 12/05/2016 Elsevier Patient Education  2020 Elsevier Inc.  

## 2019-09-08 ENCOUNTER — Telehealth: Payer: Self-pay

## 2019-09-08 LAB — CBC WITH DIFFERENTIAL/PLATELET
Basophils Absolute: 0.1 10*3/uL (ref 0.0–0.2)
Basos: 1 %
EOS (ABSOLUTE): 0.1 10*3/uL (ref 0.0–0.4)
Eos: 1 %
Hematocrit: 38.6 % (ref 34.0–46.6)
Hemoglobin: 13 g/dL (ref 11.1–15.9)
Immature Grans (Abs): 0 10*3/uL (ref 0.0–0.1)
Immature Granulocytes: 0 %
Lymphocytes Absolute: 1.4 10*3/uL (ref 0.7–3.1)
Lymphs: 16 %
MCH: 29.7 pg (ref 26.6–33.0)
MCHC: 33.7 g/dL (ref 31.5–35.7)
MCV: 88 fL (ref 79–97)
Monocytes Absolute: 0.9 10*3/uL (ref 0.1–0.9)
Monocytes: 10 %
Neutrophils Absolute: 6.4 10*3/uL (ref 1.4–7.0)
Neutrophils: 72 %
Platelets: 283 10*3/uL (ref 150–450)
RBC: 4.37 x10E6/uL (ref 3.77–5.28)
RDW: 12.6 % (ref 11.7–15.4)
WBC: 8.9 10*3/uL (ref 3.4–10.8)

## 2019-09-08 LAB — HEMOGLOBIN A1C
Est. average glucose Bld gHb Est-mCnc: 114 mg/dL
Hgb A1c MFr Bld: 5.6 % (ref 4.8–5.6)

## 2019-09-08 LAB — COMPREHENSIVE METABOLIC PANEL WITH GFR
ALT: 10 IU/L (ref 0–32)
AST: 14 IU/L (ref 0–40)
Albumin/Globulin Ratio: 2 (ref 1.2–2.2)
Albumin: 4.4 g/dL (ref 3.7–4.7)
Alkaline Phosphatase: 99 IU/L (ref 48–121)
BUN/Creatinine Ratio: 21 (ref 12–28)
BUN: 14 mg/dL (ref 8–27)
Bilirubin Total: 0.5 mg/dL (ref 0.0–1.2)
CO2: 25 mmol/L (ref 20–29)
Calcium: 9.5 mg/dL (ref 8.7–10.3)
Chloride: 96 mmol/L (ref 96–106)
Creatinine, Ser: 0.66 mg/dL (ref 0.57–1.00)
GFR calc Af Amer: 103 mL/min/1.73
GFR calc non Af Amer: 89 mL/min/1.73
Globulin, Total: 2.2 g/dL (ref 1.5–4.5)
Glucose: 82 mg/dL (ref 65–99)
Potassium: 4.2 mmol/L (ref 3.5–5.2)
Sodium: 136 mmol/L (ref 134–144)
Total Protein: 6.6 g/dL (ref 6.0–8.5)

## 2019-09-08 LAB — LIPID PANEL
Chol/HDL Ratio: 2.4 ratio (ref 0.0–4.4)
Cholesterol, Total: 187 mg/dL (ref 100–199)
HDL: 79 mg/dL
LDL Chol Calc (NIH): 92 mg/dL (ref 0–99)
Triglycerides: 89 mg/dL (ref 0–149)
VLDL Cholesterol Cal: 16 mg/dL (ref 5–40)

## 2019-09-08 LAB — TSH: TSH: 0.629 u[IU]/mL (ref 0.450–4.500)

## 2019-09-08 NOTE — Telephone Encounter (Signed)
LMTCB 09/08/2019.  PEC please advise pt of lab results below.   Thanks,   -Vernona Rieger

## 2019-09-08 NOTE — Telephone Encounter (Signed)
Result note read to patient, verbalizes understanding. ?

## 2019-09-08 NOTE — Telephone Encounter (Signed)
-----   Message from Margaretann Loveless, [place] sent at 09/08/2019  4:16 PM EDT ----- Blood count is normal. Kidney and liver function are normal. Sodium, potassium and calcium are normal. Sugar/A1c is normal. Cholesterol is normal. Thyroid is normal.

## 2019-10-05 ENCOUNTER — Telehealth: Payer: Self-pay

## 2019-10-05 ENCOUNTER — Ambulatory Visit
Admission: RE | Admit: 2019-10-05 | Discharge: 2019-10-05 | Disposition: A | Discharge status: Home / Self Care | Payer: Medicare PPO | Source: Ambulatory Visit | Attending: Physician Assistant | Admitting: Physician Assistant

## 2019-10-05 ENCOUNTER — Other Ambulatory Visit: Payer: Self-pay

## 2019-10-05 DIAGNOSIS — Z1239 Encounter for other screening for malignant neoplasm of breast: Secondary | ICD-10-CM

## 2019-10-05 DIAGNOSIS — Z1231 Encounter for screening mammogram for malignant neoplasm of breast: Secondary | ICD-10-CM | POA: Insufficient documentation

## 2019-10-05 NOTE — Telephone Encounter (Signed)
-----   Message from Margaretann Loveless, [place] sent at 10/05/2019  1:12 PM EDT ----- Normal mammogram. Repeat screening in one year.

## 2019-10-05 NOTE — Telephone Encounter (Signed)
Patient notified of MM result and PCP recommendation

## 2019-10-05 NOTE — Telephone Encounter (Signed)
LMTCB, PEC Triage Nurse may give patient results  

## 2020-02-20 ENCOUNTER — Other Ambulatory Visit: Payer: Self-pay | Admitting: Physician Assistant

## 2020-02-20 DIAGNOSIS — K219 Gastro-esophageal reflux disease without esophagitis: Secondary | ICD-10-CM

## 2020-03-16 ENCOUNTER — Other Ambulatory Visit: Payer: Self-pay | Admitting: Physician Assistant

## 2020-03-16 DIAGNOSIS — G629 Polyneuropathy, unspecified: Secondary | ICD-10-CM

## 2020-04-20 ENCOUNTER — Other Ambulatory Visit: Payer: Self-pay

## 2020-04-20 ENCOUNTER — Ambulatory Visit: Payer: Medicare PPO | Admitting: Physician Assistant

## 2020-04-20 VITALS — BP 110/80 | HR 87 | Temp 98.4°F | Wt 126.0 lb

## 2020-04-20 DIAGNOSIS — M47817 Spondylosis without myelopathy or radiculopathy, lumbosacral region: Secondary | ICD-10-CM | POA: Diagnosis not present

## 2020-04-20 DIAGNOSIS — M5137 Other intervertebral disc degeneration, lumbosacral region: Secondary | ICD-10-CM | POA: Diagnosis not present

## 2020-04-20 NOTE — Progress Notes (Signed)
Established patient visit   Patient: Vanessa Dominguez   DOB: 1946-01-16   75 y.o. Female  MRN: [id number] Visit Date: 04/20/2020  Today's healthcare provider: Margaretann Loveless, [name]   No chief complaint on file.  Subjective    HPI  Patient is a 75 year old female who present with request for referral to a Neurosurgeon.  She would like to see Dr Lovell Sheehan.  She states she has been having numbness in her legs and believes it to be from her back.   She said she has had trouble since her last visit with [place] which was in August 2021.  She also reports that she had MRI done but all that we have is DG Lumbar Spine done in May 2021.  Impression of Imaging: Degenerative disc and facet disease.  No acute bony abnormality.  Patient Active Problem List   Diagnosis Date Noted  . Pure hypercholesterolemia 02/19/2017  . Hyperkalemia 02/19/2017   Past Medical History:  Diagnosis Date  . Arthritis   . Pneumonia        Medications: Outpatient Medications Prior to Visit  Medication Sig  . Calcium Carbonate (CALCIUM 600 PO) Take 1 tablet by mouth daily.  Marland Kitchen gabapentin (NEURONTIN) 300 MG capsule TAKE 1 CAPSULE 3 TIMES DAILY  . omeprazole (PRILOSEC) 20 MG capsule TAKE 1 CAPSULE EVERY DAY   No facility-administered medications prior to visit.    Review of Systems  Constitutional: Negative.   Respiratory: Negative.   Cardiovascular: Negative.   Gastrointestinal: Negative.   Musculoskeletal: Positive for back pain, gait problem and myalgias. Negative for arthralgias, joint swelling, neck pain and neck stiffness.  Neurological: Positive for numbness (legs).    Last CBC Lab Results  Component Value Date   WBC 8.9 09/07/2019   HGB 13.0 09/07/2019   HCT 38.6 09/07/2019   MCV 88 09/07/2019   MCH 29.7 09/07/2019   RDW 12.6 09/07/2019   PLT 283 09/07/2019   Last metabolic panel Lab Results  Component Value Date   GLUCOSE 82 09/07/2019   NA 136 09/07/2019   K 4.2 09/07/2019   CL  96 09/07/2019   CO2 25 09/07/2019   BUN 14 09/07/2019   CREATININE 0.66 09/07/2019   GFRNONAA 89 09/07/2019   GFRAA 103 09/07/2019   CALCIUM 9.5 09/07/2019   PROT 6.6 09/07/2019   ALBUMIN 4.4 09/07/2019   LABGLOB 2.2 09/07/2019   AGRATIO 2.0 09/07/2019   BILITOT 0.5 09/07/2019   ALKPHOS 99 09/07/2019   AST 14 09/07/2019   ALT 10 09/07/2019   ANIONGAP 8 12/26/2015       Objective    BP 110/80 (BP Location: Left Arm, Patient Position: Sitting, Cuff Size: Normal)   Pulse 87   Temp 98.4 F (36.9 C) (Oral)   Wt 126 lb (57.2 kg)   SpO2 98%   BMI 20.97 kg/m  BP Readings from Last 3 Encounters:  04/20/20 110/80  09/07/19 (!) 135/69  06/29/19 135/86   Wt Readings from Last 3 Encounters:  04/20/20 126 lb (57.2 kg)  09/07/19 128 lb 12.8 oz (58.4 kg)  06/29/19 128 lb (58.1 kg)       Physical Exam Vitals reviewed.  Constitutional:      General: She is not in acute distress.    Appearance: Normal appearance. She is well-developed. She is not ill-appearing or diaphoretic.  HENT:     Head: Normocephalic and atraumatic.  Neck:     Thyroid: No thyromegaly.  Vascular: No JVD.     Trachea: No tracheal deviation.  Cardiovascular:     Rate and Rhythm: Normal rate and regular rhythm.     Heart sounds: Normal heart sounds. No murmur heard. No friction rub. No gallop.   Pulmonary:     Effort: Pulmonary effort is normal. No respiratory distress.     Breath sounds: Normal breath sounds. No wheezing or rales.  Musculoskeletal:     Cervical back: Normal range of motion and neck supple.     Lumbar back: Spasms, tenderness and bony tenderness present. Decreased range of motion.  Lymphadenopathy:     Cervical: No cervical adenopathy.  Neurological:     Mental Status: She is alert.      No results found for any visits on 04/20/20.  Assessment & Plan     1. DDD (degenerative disc disease), lumbosacral Worsening issue. Will refer to Dr. Yves Dill. Patient has seen him in the  past and had success with injections. Interested in trying injections and PT. Call if worsening.  - Ambulatory referral to Neurosurgery  2. Facet arthropathy, lumbosacral See above medical treatment plan. - Ambulatory referral to Neurosurgery   No follow-ups on file.      Delmer Islam, [name], have reviewed all documentation for this visit. The documentation on 04/24/20 for the exam, diagnosis, procedures, and orders are all accurate and complete.   Reine Just  [facility] [phone] (phone) [phone] (fax)  [facility]

## 2020-04-24 ENCOUNTER — Encounter: Payer: Self-pay | Admitting: Physician Assistant

## 2020-04-24 NOTE — Patient Instructions (Signed)

## 2020-05-17 ENCOUNTER — Other Ambulatory Visit: Payer: Self-pay | Admitting: Neurosurgery

## 2020-05-17 ENCOUNTER — Other Ambulatory Visit (HOSPITAL_COMMUNITY): Payer: Self-pay | Admitting: Neurosurgery

## 2020-05-17 DIAGNOSIS — R200 Anesthesia of skin: Secondary | ICD-10-CM

## 2020-05-26 ENCOUNTER — Ambulatory Visit
Admission: RE | Admit: 2020-05-26 | Discharge: 2020-05-26 | Disposition: A | Discharge status: Home / Self Care | Payer: Medicare PPO | Source: Ambulatory Visit | Attending: Neurosurgery | Admitting: Neurosurgery

## 2020-05-26 ENCOUNTER — Other Ambulatory Visit: Payer: Self-pay

## 2020-05-26 DIAGNOSIS — R200 Anesthesia of skin: Secondary | ICD-10-CM | POA: Diagnosis not present

## 2020-06-13 ENCOUNTER — Other Ambulatory Visit: Payer: Self-pay | Admitting: Physician Assistant

## 2020-06-13 DIAGNOSIS — G629 Polyneuropathy, unspecified: Secondary | ICD-10-CM

## 2020-07-28 ENCOUNTER — Other Ambulatory Visit: Payer: Self-pay | Admitting: Family Medicine

## 2020-07-28 DIAGNOSIS — G629 Polyneuropathy, unspecified: Secondary | ICD-10-CM

## 2020-09-01 ENCOUNTER — Other Ambulatory Visit: Payer: Self-pay | Admitting: *Deleted

## 2021-04-04 ENCOUNTER — Other Ambulatory Visit: Payer: Self-pay | Admitting: Family Medicine

## 2021-04-04 DIAGNOSIS — G629 Polyneuropathy, unspecified: Secondary | ICD-10-CM

## 2021-04-04 NOTE — Telephone Encounter (Signed)
Requested medications are due for refill today.  yes  Requested medications are on the active medications list.  yes  Last refill. 07/28/2020 #90 0 refills  Future visit scheduled.   no  Notes to clinic.  Pt last seen by Ms. Burnette 04/20/2020.  Pcp listed as Burnette. Please advise.    Requested Prescriptions  Pending Prescriptions Disp Refills   gabapentin (NEURONTIN) 300 MG capsule [Pharmacy Med Name: GABAPENTIN 300 MG CAP] 90 capsule 0    Sig: TAKE 1 CAPSULE 3 TIMES DAILY     Neurology: Anticonvulsants - gabapentin Failed - 04/04/2021 10:25 AM      Failed - Cr in normal range and within 360 days    Creatinine, Ser  Date Value Ref Range Status  09/07/2019 0.66 0.57 - 1.00 mg/dL Final          Passed - Completed PHQ-2 or PHQ-9 in the last 360 days      Passed - Valid encounter within last 12 months    Recent Outpatient Visits           11 months ago DDD (degenerative disc disease), lumbosacral   [facility] [place], Alessandra Bevels, [place]   1 year ago Lumbar radiculopathy   [facility] [place], Alessandra Bevels, [place]   2 years ago Medicare annual wellness visit, subsequent   [facility] [place], [place], [place]   3 years ago Primary insomnia   [facility] [place], Alessandra Bevels, [place]   3 years ago Sunburn   [facility], [place], [place]

## 2021-04-06 ENCOUNTER — Telehealth: Payer: Self-pay | Admitting: Physician Assistant

## 2021-04-06 NOTE — Telephone Encounter (Signed)
Copied from CRM [id number]. Topic: Medicare AWV ?>> Apr 06, 2021  2:57 PM Claudette Laws R wrote: ?Reason for CRM:  ?Left message for patient to call back and schedule Medicare Annual Wellness Visit (AWV) in office.  ? ?If not able to come in office, please offer to do virtually or by telephone.  ? ?Last AWV:  09/07/2019 ? ?Please schedule at anytime with [facility] Health Advisor. ? ?If any questions, please contact me at [phone] ?

## 2021-04-12 ENCOUNTER — Other Ambulatory Visit: Payer: Self-pay

## 2021-04-12 ENCOUNTER — Ambulatory Visit (INDEPENDENT_AMBULATORY_CARE_PROVIDER_SITE_OTHER): Payer: Medicare PPO

## 2021-04-12 VITALS — BP 150/82 | HR 74 | Temp 97.9°F | Ht 66.0 in | Wt 131.7 lb

## 2021-04-12 DIAGNOSIS — Z0000 Encounter for general adult medical examination without abnormal findings: Secondary | ICD-10-CM

## 2021-04-12 NOTE — Progress Notes (Signed)
Subjective:   Vanessa Dominguez is a 76 y.o. female who presents for Medicare Annual (Subsequent) preventive examination.  Review of Systems           Objective:    Today's Vitals   04/12/21 1044  BP: (!) 150/82  Pulse: 74  Temp: 97.9 F (36.6 C)  TempSrc: Oral  SpO2: 97%  Weight: 131 lb 11.2 oz (59.7 kg)  Height: 5\' 6"  (1.676 m)   Body mass index is 21.26 kg/m.  Advanced Directives 03/15/2016 12/26/2015 12/13/2015  Does Patient Have a Medical Advance Directive? Yes No Yes  Type of Advance Directive - - [place];Living will  Does patient want to make changes to medical advance directive? - - No - Patient declined  Copy of [place] in Chart? - - No - copy requested    Current Medications (verified) Outpatient Encounter Medications as of 04/12/2021  Medication Sig   Calcium Carbonate (CALCIUM 600 PO) Take 1 tablet by mouth daily.   gabapentin (NEURONTIN) 300 MG capsule TAKE 1 CAPSULE 3 TIMES DAILY   omeprazole (PRILOSEC) 20 MG capsule TAKE 1 CAPSULE EVERY DAY   No facility-administered encounter medications on file as of 04/12/2021.    Allergies (verified) Patient has no known allergies.   History: Past Medical History:  Diagnosis Date   Arthritis    Pneumonia    Past Surgical History:  Procedure Laterality Date   CATARACT EXTRACTION W/PHACO Left 12/13/2015   Procedure: CATARACT EXTRACTION PHACO AND INTRAOCULAR LENS PLACEMENT ([place]);  Surgeon: Birder Robson, MD;  Location: [facility] ORS;  Service: Ophthalmology;  Laterality: Left;  Lot# [id number] H US:00:36.0 AP%: 23.7 CDE: 8.52   CATARACT EXTRACTION W/PHACO Right 01/10/2016   Procedure: CATARACT EXTRACTION PHACO AND INTRAOCULAR LENS PLACEMENT ([place]);  Surgeon: Birder Robson, MD;  Location: [facility] ORS;  Service: Ophthalmology;  Laterality: Right;  Lot # [id number] H [place]: 00:38.4 AP%: 21.3 CDE: 8.17   CESAREAN SECTION     JOINT REPLACEMENT Left 2006   Lt knee   KNEE SURGERY Left  [phone]   TUBAL LIGATION  1989   Family History  Problem Relation Age of Onset   Heart attack Brother        10/2016   Breast cancer Neg Hx    Social History   Socioeconomic History   Marital status: Married    Spouse name: Not on file   Number of children: Not on file   Years of education: Not on file   Highest education level: Not on file  Occupational History   Not on file  Tobacco Use   Smoking status: Former   Smokeless tobacco: Never  Substance and Sexual Activity   Alcohol use: No   Drug use: No   Sexual activity: Not on file  Other Topics Concern   Not on file  Social History Narrative   Not on file   Social Determinants of Health   Financial Resource Strain: Not on file  Food Insecurity: Not on file  Transportation Needs: Not on file  Physical Activity: Not on file  Stress: Not on file  Social Connections: Not on file    Tobacco Counseling Counseling given: Not Answered   Clinical Intake:  Pre-visit preparation completed: Yes  Pain : No/denies pain     Nutritional Risks: None Diabetes: No  How often do you need to have someone help you when you read instructions, pamphlets, or other written materials from your doctor or pharmacy?: 1 - Never  Diabetic?no  Interpreter Needed?: No  Information entered by :: Kirke Shaggy, LPN   Activities of Daily Living In your present state of health, do you have any difficulty performing the following activities: 04/20/2020  Hearing? N  Vision? N  Difficulty concentrating or making decisions? N  Walking or climbing stairs? N  Dressing or bathing? N  Doing errands, shopping? N  Some recent data might be hidden    Patient Care Team: Mikey Kirschner, [name] as PCP - General (Physician Assistant) Newman Pies, MD as Consulting Physician (Neurosurgery)  Indicate any recent Medical Services you may have received from other than [facility] providers in the past year (date may be approximate).      Assessment:   This is a routine wellness examination for [name].  Hearing/Vision screen No results found.  Dietary issues and exercise activities discussed:     Goals Addressed   None    Depression Screen PHQ 2/9 Scores 04/20/2020 09/07/2019 09/04/2018 02/19/2017 03/15/2016  PHQ - 2 Score 0 1 0 0 0  PHQ- 9 Score 3 - - - -    Fall Risk Fall Risk  04/20/2020 09/07/2019 09/04/2018 02/19/2017 03/15/2016  Falls in the past year? 0 0 1 No Yes  Number falls in past yr: 0 0 0 - 1  Injury with Fall? 0 0 0 - No  Risk for fall due to : - No Fall Risks - - -  Follow up - Falls evaluation completed - - -    FALL RISK PREVENTION PERTAINING TO THE HOME:  Any stairs in or around the home? Yes  If so, are there any without handrails? No  Home free of loose throw rugs in walkways, pet beds, electrical cords, etc? Yes  Adequate lighting in your home to reduce risk of falls? Yes   ASSISTIVE DEVICES UTILIZED TO PREVENT FALLS:  Life alert? No  Use of a cane, walker or w/c? No  Grab bars in the bathroom? Yes  Shower chair or bench in shower? Yes  Elevated toilet seat or a handicapped toilet? No   TIMED UP AND GO:  Was the test performed? Yes .  Length of time to ambulate 10 feet: 4 sec.   Gait steady and fast without use of assistive device  Cognitive Function:Normal cognitive status assessed by direct observation by this Nurse Health Advisor. No abnormalities found.       6CIT Screen 09/07/2019 09/04/2018  What Year? 0 points 0 points  What month? 0 points 0 points  What time? 0 points 0 points  Count back from 20 0 points 0 points  Months in reverse 0 points 0 points  Repeat phrase 2 points 0 points  Total Score 2 0    Immunizations Immunization History  Administered Date(s) Administered   Influenza, High Dose Seasonal PF 03/15/2016, 10/16/2017   Influenza,inj,Quad PF,6+ Mos 02/09/2015   Influenza-Unspecified 11/09/2016, 11/18/2018, 11/17/2019   Moderna Sars-Covid-2 Vaccination  04/02/2019, 04/30/2019   Pneumococcal Conjugate-13 02/25/2017   Pneumococcal Polysaccharide-23 02/09/2015   Tdap 07/02/2017   Zoster Recombinat (Shingrix) 10/16/2017, 12/16/2017   Zoster, Live 07/30/2012    TDAP status: Up to date  Flu Vaccine status: Up to date  Pneumococcal vaccine status: Up to date  Covid-19 vaccine status: Completed vaccines  Qualifies for Shingles Vaccine? Yes   Zostavax completed Yes   Shingrix Completed?: Yes  Screening Tests Health Maintenance  Topic Date Due   COVID-19 Vaccine (3 - Booster for Moderna series) 06/25/2019   COLON CANCER SCREENING ANNUAL FOBT  01/11/2020   INFLUENZA VACCINE  09/05/2020   MAMMOGRAM  10/04/2021   TETANUS/TDAP  07/03/2027   Pneumonia Vaccine 3+ Years old  Completed   DEXA SCAN  Completed   Hepatitis C Screening  Completed   Zoster Vaccines- Shingrix  Completed   HPV VACCINES  Aged Out   COLONOSCOPY (Pts 45-88yrs Insurance coverage will need to be confirmed)  [place] Maintenance Due  Topic Date Due   COVID-19 Vaccine (3 - Booster for Moderna series) 06/25/2019   COLON CANCER SCREENING ANNUAL FOBT  01/11/2020   INFLUENZA VACCINE  09/05/2020    Colorectal cancer screening: Type of screening: FOBT/FIT. Completed 10/24/17. Repeat every 3 years- pt to call insurance to have another test sent to her  Mammogram status: Completed 10/05/19. Repeat every year- declined referral  Bone Density status: Completed 03/19/17. Results reflect: Bone density results: NORMAL. Repeat every 5 years.  Lung Cancer Screening: (Low Dose CT Chest recommended if Age 10-80 years, 30 pack-year currently smoking OR have quit w/in 15years.) does qualify.   Lung Cancer Screening Referral: declined referral  Additional Screening:  Hepatitis C Screening: does qualify; Completed 03/16/16  Vision Screening: Recommended annual ophthalmology exams for early detection of glaucoma and other disorders of the eye. Is  the patient up to date with their annual eye exam?  Yes  Who is the provider or what is the name of the office in which the patient attends annual eye exams? [facility] If pt is not established with a provider, would they like to be referred to a provider to establish care? No .   Dental Screening: Recommended annual dental exams for proper oral hygiene  Community Resource Referral / Chronic Care Management: CRR required this visit?  No   CCM required this visit?  No      Plan:     I have personally reviewed and noted the following in the patients chart:   Medical and social history Use of alcohol, tobacco or illicit drugs  Current medications and supplements including opioid prescriptions.  Functional ability and status Nutritional status Physical activity Advanced directives List of other physicians Hospitalizations, surgeries, and ER visits in previous 12 months Vitals Screenings to include cognitive, depression, and falls Referrals and appointments  In addition, I have reviewed and discussed with patient certain preventive protocols, quality metrics, and best practice recommendations. A written personalized care plan for preventive services as well as general preventive health recommendations were provided to patient.     Dionisio David, LPN   [id number]   Nurse Notes: none

## 2021-04-12 NOTE — Patient Instructions (Addendum)
Vanessa Dominguez , Thank you for taking time to come for your Medicare Wellness Visit. I appreciate your ongoing commitment to your health goals. Please review the following plan we discussed and let me know if I can assist you in the future.   Screening recommendations/referrals: Colonoscopy: FIT 10/24/17, pt to call ins.for another test Mammogram: 10/05/19, declined referral Bone Density: 03/19/17 Recommended yearly ophthalmology/optometry visit for glaucoma screening and checkup Recommended yearly dental visit for hygiene and checkup  Vaccinations: Influenza vaccine: has at [facility] this season Pneumococcal vaccine: 02/25/17 Tdap vaccine: 07/02/17 Shingles vaccine: Shingrix 10/16/17, 12/16/17  Zostavax 07/30/12   Covid-19:04/02/19, 04/30/19, 12/24/19  Advanced directives: no  Conditions/risks identified: none  Next appointment: Follow up in one year for your annual wellness visit - 04/16/21 @ 11am in person   Preventive Care 65 Years and Older, Female Preventive care refers to lifestyle choices and visits with your health care provider that can promote health and wellness. What does preventive care include? A yearly physical exam. This is also called an annual well check. Dental exams once or twice a year. Routine eye exams. Ask your health care provider how often you should have your eyes checked. Personal lifestyle choices, including: Daily care of your teeth and gums. Regular physical activity. Eating a healthy diet. Avoiding tobacco and drug use. Limiting alcohol use. Practicing safe sex. Taking low-dose aspirin every day. Taking vitamin and mineral supplements as recommended by your health care provider. What happens during an annual well check? The services and screenings done by your health care provider during your annual well check will depend on your age, overall health, lifestyle risk factors, and family history of disease. Counseling  Your health care provider may ask you  questions about your: Alcohol use. Tobacco use. Drug use. Emotional well-being. Home and relationship well-being. Sexual activity. Eating habits. History of falls. Memory and ability to understand (cognition). Work and work Astronomer. Reproductive health. Screening  You may have the following tests or measurements: Height, weight, and BMI. Blood pressure. Lipid and cholesterol levels. These may be checked every 5 years, or more frequently if you are over 63 years old. Skin check. Lung cancer screening. You may have this screening every year starting at age 33 if you have a 30-pack-year history of smoking and currently smoke or have quit within the past 15 years. Fecal occult blood test (FOBT) of the stool. You may have this test every year starting at age 89. Flexible sigmoidoscopy or colonoscopy. You may have a sigmoidoscopy every 5 years or a colonoscopy every 10 years starting at age 25. Hepatitis C blood test. Hepatitis B blood test. Sexually transmitted disease (STD) testing. Diabetes screening. This is done by checking your blood sugar (glucose) after you have not eaten for a while (fasting). You may have this done every 1-3 years. Bone density scan. This is done to screen for osteoporosis. You may have this done starting at age 75. Mammogram. This may be done every 1-2 years. Talk to your health care provider about how often you should have regular mammograms. Talk with your health care provider about your test results, treatment options, and if necessary, the need for more tests. Vaccines  Your health care provider may recommend certain vaccines, such as: Influenza vaccine. This is recommended every year. Tetanus, diphtheria, and acellular pertussis (Tdap, Td) vaccine. You may need a Td booster every 10 years. Zoster vaccine. You may need this after age 81. Pneumococcal 13-valent conjugate (PCV13) vaccine. One dose is recommended  after age 23. Pneumococcal polysaccharide  (PPSV23) vaccine. One dose is recommended after age 69. Talk to your health care provider about which screenings and vaccines you need and how often you need them. This information is not intended to replace advice given to you by your health care provider. Make sure you discuss any questions you have with your health care provider. Document Released: 02/18/2015 Document Revised: 10/12/2015 Document Reviewed: 11/23/2014 Elsevier Interactive Patient Education  2017 [employer] Prevention in the Home Falls can cause injuries. They can happen to people of all ages. There are many things you can do to make your home safe and to help prevent falls. What can I do on the outside of my home? Regularly fix the edges of walkways and driveways and fix any cracks. Remove anything that might make you trip as you walk through a door, such as a raised step or threshold. Trim any bushes or trees on the path to your home. Use bright outdoor lighting. Clear any walking paths of anything that might make someone trip, such as rocks or tools. Regularly check to see if handrails are loose or broken. Make sure that both sides of any steps have handrails. Any raised decks and porches should have guardrails on the edges. Have any leaves, snow, or ice cleared regularly. Use sand or salt on walking paths during winter. Clean up any spills in your garage right away. This includes oil or grease spills. What can I do in the bathroom? Use night lights. Install grab bars by the toilet and in the tub and shower. Do not use towel bars as grab bars. Use non-skid mats or decals in the tub or shower. If you need to sit down in the shower, use a plastic, non-slip stool. Keep the floor dry. Clean up any water that spills on the floor as soon as it happens. Remove soap buildup in the tub or shower regularly. Attach bath mats securely with double-sided non-slip rug tape. Do not have throw rugs and other things on the  floor that can make you trip. What can I do in the bedroom? Use night lights. Make sure that you have a light by your bed that is easy to reach. Do not use any sheets or blankets that are too big for your bed. They should not hang down onto the floor. Have a firm chair that has side arms. You can use this for support while you get dressed. Do not have throw rugs and other things on the floor that can make you trip. What can I do in the kitchen? Clean up any spills right away. Avoid walking on wet floors. Keep items that you use a lot in easy-to-reach places. If you need to reach something above you, use a strong step stool that has a grab bar. Keep electrical cords out of the way. Do not use floor polish or wax that makes floors slippery. If you must use wax, use non-skid floor wax. Do not have throw rugs and other things on the floor that can make you trip. What can I do with my stairs? Do not leave any items on the stairs. Make sure that there are handrails on both sides of the stairs and use them. Fix handrails that are broken or loose. Make sure that handrails are as long as the stairways. Check any carpeting to make sure that it is firmly attached to the stairs. Fix any carpet that is loose or worn. Avoid having throw  rugs at the top or bottom of the stairs. If you do have throw rugs, attach them to the floor with carpet tape. Make sure that you have a light switch at the top of the stairs and the bottom of the stairs. If you do not have them, ask someone to add them for you. What else can I do to help prevent falls? Wear shoes that: Do not have high heels. Have rubber bottoms. Are comfortable and fit you well. Are closed at the toe. Do not wear sandals. If you use a stepladder: Make sure that it is fully opened. Do not climb a closed stepladder. Make sure that both sides of the stepladder are locked into place. Ask someone to hold it for you, if possible. Clearly mark and make  sure that you can see: Any grab bars or handrails. First and last steps. Where the edge of each step is. Use tools that help you move around (mobility aids) if they are needed. These include: Canes. Walkers. Scooters. Crutches. Turn on the lights when you go into a dark area. Replace any light bulbs as soon as they burn out. Set up your furniture so you have a clear path. Avoid moving your furniture around. If any of your floors are uneven, fix them. If there are any pets around you, be aware of where they are. Review your medicines with your doctor. Some medicines can make you feel dizzy. This can increase your chance of falling. Ask your doctor what other things that you can do to help prevent falls. This information is not intended to replace advice given to you by your health care provider. Make sure you discuss any questions you have with your health care provider. Document Released: 11/18/2008 Document Revised: 06/30/2015 Document Reviewed: 02/26/2014 Elsevier Interactive Patient Education  2017 [employer].

## 2021-04-18 NOTE — Progress Notes (Deleted)
? ? ?I,Sha'taria Opel Lejeune,acting as a Neurosurgeon for [employer], [name].,have documented all relevant documentation on the behalf of [name], [name],as directed by  [name], [name] while in the presence of [name], [name]. ? ? ?Annual Wellness Visit ? ?  ? ?Patient: Vanessa Dominguez, Female    DOB: [birth year], 76 y.o.   MRN: [id number] ?Visit Date: 04/19/2021 ? ?Today's Provider: [name], [name]  ? ?No chief complaint on file. ? ?Subjective  ?  ?AINHOA RALLO is a 76 y.o. female who presents today for her Annual Wellness Visit. ?She reports consuming a {diet types:17450} diet. {Exercise:19826} She generally feels {well/fairly well/poorly:18703}. She reports sleeping {well/fairly well/poorly:18703}. She {does/does not:200015} have additional problems to discuss today.  ? ?HPI ? ? ? ?Medications: ?Outpatient Medications Prior to Visit  ?Medication Sig  ? Calcium Carbonate (CALCIUM 600 PO) Take 1 tablet by mouth daily.  ? gabapentin (NEURONTIN) 300 MG capsule TAKE 1 CAPSULE 3 TIMES DAILY  ? omeprazole (PRILOSEC) 20 MG capsule TAKE 1 CAPSULE EVERY DAY  ? ?No facility-administered medications prior to visit.  ?  ?No Known Allergies ? ?Patient Care Team: ?Burnett Corrente as PCP - General (Physician Assistant) ?Tressie Stalker, MD as Consulting Physician (Neurosurgery) ? ?Review of Systems ? ?{Labs  Heme  Chem  Endocrine  Serology  Results Review (optional):23779} ?  ? Objective  ?  ?Vitals: There were no vitals taken for this visit. ?{Show previous vital signs (optional):23777} ? ? ?Physical Exam ?*** ? ?Most recent functional status assessment: ?In your present state of health, do you have any difficulty performing the following activities: 04/12/2021  ?Hearing? N  ?Vision? N  ?Difficulty concentrating or making decisions? N  ?Walking or climbing stairs? Y  ?Dressing or bathing? N  ?Doing errands, shopping? N  ?Preparing Food and eating ? N  ?Using the Toilet? N  ?In the past six months, have  you accidently leaked urine? N  ?Do you have problems with loss of bowel control? N  ?Managing your Medications? N  ?Managing your Finances? N  ?Housekeeping or managing your Housekeeping? N  ?Some recent data might be hidden  ? ?Most recent fall risk assessment: ?Fall Risk  04/12/2021  ?Falls in the past year? 0  ?Number falls in past yr: 0  ?Injury with Fall? 0  ?Risk for fall due to : No Fall Risks  ?Follow up Falls evaluation completed  ? ? Most recent depression screenings: ?PHQ 2/9 Scores 04/12/2021 04/20/2020  ?PHQ - 2 Score 0 0  ?PHQ- 9 Score - 3  ? ?Most recent cognitive screening: ?6CIT Screen 09/07/2019  ?What Year? 0 points  ?What month? 0 points  ?What time? 0 points  ?Count back from 20 0 points  ?Months in reverse 0 points  ?Repeat phrase 2 points  ?Total Score 2  ? ?Most recent Audit-C alcohol use screening ?Alcohol Use Disorder Test (AUDIT) 04/12/2021  ?1. How often do you have a drink containing alcohol? 0  ?2. How many drinks containing alcohol do you have on a typical day when you are drinking? 0  ?3. How often do you have six or more drinks on one occasion? 0  ?AUDIT-C Score 0  ?Alcohol Brief Interventions/Follow-up -  ? ?A score of 3 or more in women, and 4 or more in men indicates increased risk for alcohol abuse, EXCEPT if all of the points are from question 1  ? ?No results found for any visits on 04/19/21. ? Assessment & Plan  ?  ? ?  Annual wellness visit done today including the all of the following: ?Reviewed patient's Family Medical History ?Reviewed and updated list of patient's medical providers ?Assessment of cognitive impairment was done ?Assessed patient's functional ability ?Established a written schedule for health screening services ?Health Risk Assessent Completed and Reviewed ? ?Exercise Activities and Dietary recommendations ? Goals   ? ?  DIET - EAT MORE FRUITS AND VEGETABLES   ? ?  ? ? ?Immunization History  ?Administered Date(s) Administered  ? Influenza, High Dose Seasonal PF  03/15/2016, 10/16/2017  ? Influenza,inj,Quad PF,6+ Mos 02/09/2015  ? Influenza-Unspecified 11/09/2016, 11/18/2018, 11/17/2019  ? Moderna Sars-Covid-2 Vaccination 04/02/2019, 04/30/2019  ? PFIZER Comirnaty(Gray Top)Covid-19 Tri-Sucrose Vaccine 12/24/2019  ? Pneumococcal Conjugate-13 02/25/2017  ? Pneumococcal Polysaccharide-23 02/09/2015  ? Tdap 07/02/2017  ? Zoster Recombinat (Shingrix) 10/16/2017, 12/16/2017  ? Zoster, Live 07/30/2012  ? ? ?Health Maintenance  ?Topic Date Due  ? COLON CANCER SCREENING ANNUAL FOBT  01/11/2020  ? COVID-19 Vaccine (4 - Booster for Moderna series) 02/18/2020  ? INFLUENZA VACCINE  09/05/2020  ? MAMMOGRAM  10/04/2021  ? TETANUS/TDAP  07/03/2027  ? Pneumonia Vaccine 66+ Years old  Completed  ? DEXA SCAN  Completed  ? Hepatitis C Screening  Completed  ? Zoster Vaccines- Shingrix  Completed  ? HPV VACCINES  Aged Out  ? COLONOSCOPY (Pts 45-30yrs Insurance coverage will need to be confirmed)  Discontinued  ? ? ? ?Discussed health benefits of physical activity, and encouraged her to engage in regular exercise appropriate for her age and condition.  ?  ?*** ? ?No follow-ups on file.  ?  ? ?{provider attestation***:1} ? ? ?[name], [name]  ?[facility] ?[phone] (phone) ?[phone] (fax) ? ?[facility]  ? ?

## 2021-04-19 ENCOUNTER — Encounter: Payer: Medicare PPO | Admitting: Physician Assistant

## 2021-04-27 NOTE — Progress Notes (Signed)
? ?I,[name],acting as a Neurosurgeon for [employer], [name].,have documented all relevant documentation on the behalf of [name], [name],as directed by  [name], [name] while in the presence of [name], [name]. ? ? ?Complete physical exam ? ? ?Patient: Vanessa Dominguez   DOB: 1945/02/20   76 y.o. Female  MRN: [id number] ?Visit Date: 04/28/2021 ? ?Today's healthcare provider: [name], [name]  ? ?Cc. cpe ? ?Subjective  ?  ?Vanessa Dominguez is a 76 y.o. female who presents today for a complete physical exam.  ?She reports consuming a general diet.  The patient reports walking 3 miles a day and does back exercises for about 20 minutes.   She generally feels well. She reports sleeping fairly well. She does not have additional problems to discuss today.  ? ? ?Past Medical History:  ?Diagnosis Date  ? Arthritis   ? Pneumonia   ? ?Past Surgical History:  ?Procedure Laterality Date  ? CATARACT EXTRACTION W/PHACO Left 12/13/2015  ? Procedure: CATARACT EXTRACTION PHACO AND INTRAOCULAR LENS PLACEMENT (IOC);  Surgeon: Galen Manila, MD;  Location: [facility] ORS;  Service: Ophthalmology;  Laterality: Left;  Lot# [id number] ?US:00:36.0 ?AP%: 23.7 ?CDE: 8.52  ? CATARACT EXTRACTION W/PHACO Right 01/10/2016  ? Procedure: CATARACT EXTRACTION PHACO AND INTRAOCULAR LENS PLACEMENT (IOC);  Surgeon: Galen Manila, MD;  Location: [facility] ORS;  Service: Ophthalmology;  Laterality: Right;  Lot # [id number] ?[place]: 00:38.4 ?AP%: 21.3 ?CDE: 8.17  ? CESAREAN SECTION    ? JOINT REPLACEMENT Left 2006  ? Lt knee  ? KNEE SURGERY Left [phone]  ? TUBAL LIGATION  1989  ? ?Social History  ? ?Socioeconomic History  ? Marital status: Married  ?  Spouse name: Not on file  ? Number of children: Not on file  ? Years of education: Not on file  ? Highest education level: Not on file  ?Occupational History  ? Not on file  ?Tobacco Use  ? Smoking status: Former  ? Smokeless tobacco: Never  ?Substance and Sexual Activity  ? Alcohol use: No  ?  Drug use: No  ? Sexual activity: Not on file  ?Other Topics Concern  ? Not on file  ?Social History Narrative  ? Not on file  ? ?Social Determinants of Health  ? ?Financial Resource Strain: Low Risk   ? Difficulty of Paying Living Expenses: Not hard at all  ?Food Insecurity: No Food Insecurity  ? Worried About Programme researcher, broadcasting/film/video in the Last Year: Never true  ? Ran Out of Food in the Last Year: Never true  ?Transportation Needs: No Transportation Needs  ? Lack of Transportation (Medical): No  ? Lack of Transportation (Non-Medical): No  ?Physical Activity: Sufficiently Active  ? Days of Exercise per Week: 7 days  ? Minutes of Exercise per Session: 60 min  ?Stress: No Stress Concern Present  ? Feeling of Stress : Only a little  ?Social Connections: Moderately Isolated  ? Frequency of Communication with Friends and Family: Three times a week  ? Frequency of Social Gatherings with Friends and Family: More than three times a week  ? Attends Religious Services: Never  ? Active Member of Clubs or Organizations: No  ? Attends Banker Meetings: Never  ? Marital Status: Married  ?Intimate Partner Violence: Not At Risk  ? Fear of Current or Ex-Partner: No  ? Emotionally Abused: No  ? Physically Abused: No  ? Sexually Abused: No  ? ?Family Status  ?Relation Name Status  ? Mother  Deceased at age 74  ? Father  Deceased at age 74  ?     Died from cancer  ? Brother  Deceased  ? Neg Hx  (Not Specified)  ? ?Family History  ?Problem Relation Age of Onset  ? Heart attack Brother   ?     10/2016  ? Breast cancer Neg Hx   ? ?No Known Allergies  ?Patient Care Team: ?Burnett Correnterubel, Kariyah Baugh, [name] as PCP - General (Physician Assistant) ?Tressie StalkerJenkins, Jeffrey, MD as Consulting Physician (Neurosurgery)  ? ?Medications: ?Outpatient Medications Prior to Visit  ?Medication Sig  ? Calcium Carbonate (CALCIUM 600 PO) Take 1 tablet by mouth daily.  ? gabapentin (NEURONTIN) 300 MG capsule TAKE 1 CAPSULE 3 TIMES DAILY  ? omeprazole (PRILOSEC) 20 MG  capsule TAKE 1 CAPSULE EVERY DAY  ? ?No facility-administered medications prior to visit.  ? ? ?Review of Systems  ?Constitutional: Negative.  Negative for fatigue and fever.  ?Eyes: Negative.   ?Respiratory:  Negative for cough and shortness of breath.   ?Cardiovascular: Negative.  Negative for chest pain and leg swelling.  ?Gastrointestinal: Negative.  Negative for abdominal pain.  ?Endocrine: Negative.   ?Genitourinary: Negative.   ?Musculoskeletal: Negative.   ?Skin: Negative.   ?Allergic/Immunologic: Negative.   ?Neurological: Negative.  Negative for dizziness and headaches.  ?Hematological: Negative.   ?Psychiatric/Behavioral: Negative.    ? ? ? Objective  ?  ?Blood pressure 118/81, pulse 76, height 5\' 7"  (1.702 m), weight 131 lb 1.6 oz (59.5 kg), SpO2 (!) 89 %.  ?Physical Exam ?Constitutional:   ?   General: She is awake.  ?   Appearance: She is well-developed.  ?HENT:  ?   Head: Normocephalic.  ?   Right Ear: Tympanic membrane normal.  ?   Left Ear: Tympanic membrane normal.  ?Eyes:  ?   Conjunctiva/sclera: Conjunctivae normal.  ?   Pupils: Pupils are equal, round, and reactive to light.  ?Neck:  ?   Thyroid: No thyroid mass or thyromegaly.  ?Cardiovascular:  ?   Rate and Rhythm: Normal rate and regular rhythm.  ?   Heart sounds: Normal heart sounds.  ?Pulmonary:  ?   Effort: Pulmonary effort is normal.  ?   Breath sounds: Normal breath sounds.  ?Abdominal:  ?   Palpations: Abdomen is soft.  ?   Tenderness: There is no abdominal tenderness.  ?Musculoskeletal:  ?   Right lower leg: No swelling. No edema.  ?   Left lower leg: No swelling. No edema.  ?Lymphadenopathy:  ?   Cervical: No cervical adenopathy.  ?Skin: ?   General: Skin is warm.  ?Neurological:  ?   Mental Status: She is alert and oriented to person, place, and time.  ?Psychiatric:     ?   Attention and Perception: Attention normal.     ?   Mood and Affect: Mood normal.     ?   Speech: Speech normal.     ?   Behavior: Behavior is cooperative.  ?   ?Last depression screening scores ? ?  04/12/2021  ? 10:48 AM 04/20/2020  ? 11:26 AM 09/07/2019  ?  9:10 AM  ?PHQ 2/9 Scores  ?PHQ - 2 Score 0 0 1  ?PHQ- 9 Score  3   ? ?Last fall risk screening ? ?  04/12/2021  ? 10:50 AM  ?Fall Risk   ?Falls in the past year? 0  ?Number falls in past yr: 0  ?Injury with Fall? 0  ?Risk for  fall due to : No Fall Risks  ?Follow up Falls evaluation completed  ? ?Last Audit-C alcohol use screening ? ?  04/12/2021  ? 10:47 AM  ?Alcohol Use Disorder Test (AUDIT)  ?1. How often do you have a drink containing alcohol? 0  ?2. How many drinks containing alcohol do you have on a typical day when you are drinking? 0  ?3. How often do you have six or more drinks on one occasion? 0  ?AUDIT-C Score 0  ? ?A score of 3 or more in women, and 4 or more in men indicates increased risk for alcohol abuse, EXCEPT if all of the points are from question 1  ? ?No results found for any visits on 04/28/21. ? Assessment & Plan  ?  ?Routine Health Maintenance and Physical Exam ? ?Exercise Activities and Dietary recommendations ?--balanced diet high in fiber and protein, low in sugars, carbs, fats. ?--physical activity/exercise 30 minutes 3-5 times a week  ? ?Immunization History  ?Administered Date(s) Administered  ? Influenza, High Dose Seasonal PF 03/15/2016, 10/16/2017  ? Influenza,inj,Quad PF,6+ Mos 02/09/2015  ? Influenza-Unspecified 11/09/2016, 11/18/2018, 11/17/2019  ? Moderna Sars-Covid-2 Vaccination 04/02/2019, 04/30/2019  ? PFIZER Comirnaty(Gray Top)Covid-19 Tri-Sucrose Vaccine 12/24/2019  ? Pneumococcal Conjugate-13 02/25/2017  ? Pneumococcal Polysaccharide-23 02/09/2015  ? Tdap 07/02/2017  ? Zoster Recombinat (Shingrix) 10/16/2017, 12/16/2017  ? Zoster, Live 07/30/2012  ? ? ?Health Maintenance  ?Topic Date Due  ? COLON CANCER SCREENING ANNUAL FOBT  01/11/2020  ? COVID-19 Vaccine (4 - Booster for Moderna series) 02/18/2020  ? INFLUENZA VACCINE  09/05/2020  ? MAMMOGRAM  10/04/2021  ? TETANUS/TDAP  07/03/2027   ? Pneumonia Vaccine 1+ Years old  Completed  ? DEXA SCAN  Completed  ? Hepatitis C Screening  Completed  ? Zoster Vaccines- Shingrix  Completed  ? HPV VACCINES  Aged Out  ? COLONOSCOPY (Pts 45-29yrs Insu

## 2021-04-28 ENCOUNTER — Encounter: Payer: Self-pay | Admitting: Physician Assistant

## 2021-04-28 ENCOUNTER — Ambulatory Visit (INDEPENDENT_AMBULATORY_CARE_PROVIDER_SITE_OTHER): Payer: Medicare PPO | Admitting: Physician Assistant

## 2021-04-28 ENCOUNTER — Other Ambulatory Visit: Payer: Self-pay

## 2021-04-28 VITALS — BP 118/81 | HR 76 | Ht 67.0 in | Wt 131.1 lb

## 2021-04-28 DIAGNOSIS — Z0000 Encounter for general adult medical examination without abnormal findings: Secondary | ICD-10-CM

## 2021-04-28 DIAGNOSIS — Z1211 Encounter for screening for malignant neoplasm of colon: Secondary | ICD-10-CM | POA: Diagnosis not present

## 2021-04-28 DIAGNOSIS — K219 Gastro-esophageal reflux disease without esophagitis: Secondary | ICD-10-CM

## 2021-04-28 MED ORDER — OMEPRAZOLE 20 MG PO CPDR: 20.0000 mg | DELAYED_RELEASE_CAPSULE | Freq: Every day | ORAL | 3 refills | Status: DC

## 2021-04-28 NOTE — Assessment & Plan Note (Signed)
Stable, continue omeprazole 20 mg

## 2021-04-29 LAB — CBC WITH DIFFERENTIAL/PLATELET
Basophils Absolute: 0.1 10*3/uL (ref 0.0–0.2)
Basos: 2 %
EOS (ABSOLUTE): 0.2 10*3/uL (ref 0.0–0.4)
Eos: 3 %
Hematocrit: 40 % (ref 34.0–46.6)
Hemoglobin: 13.6 g/dL (ref 11.1–15.9)
Immature Grans (Abs): 0 10*3/uL (ref 0.0–0.1)
Immature Granulocytes: 0 %
Lymphocytes Absolute: 1.7 10*3/uL (ref 0.7–3.1)
Lymphs: 31 %
MCH: 30.3 pg (ref 26.6–33.0)
MCHC: 34 g/dL (ref 31.5–35.7)
MCV: 89 fL (ref 79–97)
Monocytes Absolute: 0.4 10*3/uL (ref 0.1–0.9)
Monocytes: 8 %
Neutrophils Absolute: 3 10*3/uL (ref 1.4–7.0)
Neutrophils: 56 %
Platelets: 291 10*3/uL (ref 150–450)
RBC: 4.49 x10E6/uL (ref 3.77–5.28)
RDW: 12.6 % (ref 11.7–15.4)
WBC: 5.5 10*3/uL (ref 3.4–10.8)

## 2021-04-29 LAB — COMPREHENSIVE METABOLIC PANEL
ALT: 11 IU/L (ref 0–32)
AST: 17 IU/L (ref 0–40)
Albumin/Globulin Ratio: 2.8 — ABNORMAL HIGH (ref 1.2–2.2)
Albumin: 4.8 g/dL — ABNORMAL HIGH (ref 3.7–4.7)
Alkaline Phosphatase: 84 IU/L (ref 44–121)
BUN/Creatinine Ratio: 20 (ref 12–28)
BUN: 15 mg/dL (ref 8–27)
Bilirubin Total: 0.3 mg/dL (ref 0.0–1.2)
CO2: 26 mmol/L (ref 20–29)
Calcium: 9.7 mg/dL (ref 8.7–10.3)
Chloride: 101 mmol/L (ref 96–106)
Creatinine, Ser: 0.75 mg/dL (ref 0.57–1.00)
Globulin, Total: 1.7 g/dL (ref 1.5–4.5)
Glucose: 92 mg/dL (ref 70–99)
Potassium: 5.4 mmol/L — ABNORMAL HIGH (ref 3.5–5.2)
Sodium: 143 mmol/L (ref 134–144)
Total Protein: 6.5 g/dL (ref 6.0–8.5)
eGFR: 84 mL/min/{1.73_m2} (ref >59)

## 2021-04-29 LAB — LIPID PANEL
Chol/HDL Ratio: 2.3 ratio (ref 0.0–4.4)
Cholesterol, Total: 214 mg/dL — ABNORMAL HIGH (ref 100–199)
HDL: 94 mg/dL (ref >39)
LDL Chol Calc (NIH): 109 mg/dL — ABNORMAL HIGH (ref 0–99)
Triglycerides: 62 mg/dL (ref 0–149)
VLDL Cholesterol Cal: 11 mg/dL (ref 5–40)

## 2021-04-29 LAB — HEMOGLOBIN A1C
Est. average glucose Bld gHb Est-mCnc: 114 mg/dL
Hgb A1c MFr Bld: 5.6 % (ref 4.8–5.6)

## 2021-04-29 LAB — TSH+FREE T4
Free T4: 1.16 ng/dL (ref 0.82–1.77)
TSH: 0.55 u[IU]/mL (ref 0.450–4.500)

## 2021-05-02 ENCOUNTER — Other Ambulatory Visit (INDEPENDENT_AMBULATORY_CARE_PROVIDER_SITE_OTHER): Payer: Medicare PPO

## 2021-05-02 ENCOUNTER — Telehealth: Payer: Self-pay

## 2021-05-02 DIAGNOSIS — Z1211 Encounter for screening for malignant neoplasm of colon: Secondary | ICD-10-CM | POA: Diagnosis not present

## 2021-05-02 LAB — IFOBT (OCCULT BLOOD): IFOBT: NEGATIVE

## 2021-05-02 NOTE — Telephone Encounter (Signed)
Pt given lab results per notes of [name], [name] on 05/01/21. Pt verbalized understanding. Labs will need to be ordered. ? ? ? ?[name], [name]  ?05/01/2021  8:28 AM EDT   ?  ?Potassium is slightly elevated, would repeat BMP in 3 weeks.  ?  ?Otherwise everything else looks fine/stable  ? ?

## 2021-05-19 LAB — COMPREHENSIVE METABOLIC PANEL WITH GFR
ALT: 12 [IU]/L (ref 0–32)
AST: 17 [IU]/L (ref 0–40)
Albumin/Globulin Ratio: 2.5 — ABNORMAL HIGH (ref 1.2–2.2)
Albumin: 4.7 g/dL (ref 3.7–4.7)
Alkaline Phosphatase: 91 [IU]/L (ref 44–121)
BUN/Creatinine Ratio: 20 (ref 12–28)
BUN: 18 mg/dL (ref 8–27)
Bilirubin Total: 0.4 mg/dL (ref 0.0–1.2)
CO2: 25 mmol/L (ref 20–29)
Calcium: 9.9 mg/dL (ref 8.7–10.3)
Chloride: 103 mmol/L (ref 96–106)
Creatinine, Ser: 0.9 mg/dL (ref 0.57–1.00)
Globulin, Total: 1.9 g/dL (ref 1.5–4.5)
Glucose: 88 mg/dL (ref 70–99)
Potassium: 5 mmol/L (ref 3.5–5.2)
Sodium: 142 mmol/L (ref 134–144)
Total Protein: 6.6 g/dL (ref 6.0–8.5)
eGFR: 68 mL/min/{1.73_m2}

## 2021-07-04 NOTE — Progress Notes (Unsigned)
    I,Vanessa Dominguez,acting as a Neurosurgeon for [employer], [name].,have documented all relevant documentation on the behalf of Vanessa Ferguson, [name],as directed by  Vanessa Ferguson, [name] while in the presence of Vanessa Ferguson, [name].   Acute Office Visit  Subjective:     Patient ID: Vanessa Dominguez, female    DOB: 05/03/1945, 76 y.o.   MRN: [id number]  No chief complaint on file.   HPI Patient is in today for lips swollen for one week  ROS      Objective:    There were no vitals taken for this visit. {Vitals History (Optional):23777}  Physical Exam  No results found for any visits on 07/05/21.      Assessment & Plan:   Problem List Items Addressed This Visit   None   No orders of the defined types were placed in this encounter.   No follow-ups on file.  Acey Lav, CMA

## 2021-07-05 ENCOUNTER — Ambulatory Visit: Payer: Medicare PPO | Admitting: Physician Assistant

## 2021-07-05 ENCOUNTER — Encounter: Payer: Self-pay | Admitting: Physician Assistant

## 2021-07-05 VITALS — BP 120/52 | HR 139 | Ht 66.0 in | Wt 128.6 lb

## 2021-07-05 DIAGNOSIS — R600 Localized edema: Secondary | ICD-10-CM

## 2021-07-11 ENCOUNTER — Other Ambulatory Visit: Payer: Self-pay | Admitting: Physician Assistant

## 2021-07-11 ENCOUNTER — Ambulatory Visit: Payer: Self-pay | Admitting: *Deleted

## 2021-07-11 DIAGNOSIS — W57XXXA Bitten or stung by nonvenomous insect and other nonvenomous arthropods, initial encounter: Secondary | ICD-10-CM

## 2021-07-11 MED ORDER — DOXYCYCLINE HYCLATE 100 MG PO TABS: 200.0000 mg | ORAL_TABLET | Freq: Once | ORAL | 0 refills | Status: AC

## 2021-07-11 NOTE — Telephone Encounter (Signed)
Summary: tick question   Patient states that she took a tick off (attached, had a hard time getting it off).  She has it in a piece of tape.  Location was right arm.  Patient states that there was a white spot on the tick (thinks it may have been a [employer] tick).  Patient would like to know what she needs to take or how to treat from this point.  Patient reports she is feeling fine.  States daughter is a Engineer, civil (consulting) and she wanted her to call in for advice.      Reason for Disposition  Deer tick bite with no complications  Answer Assessment - Initial Assessment Questions 1. TYPE of TICK: "Is it a wood tick or a deer tick?" (e.g., deer tick, wood tick; unsure)     Lone star- tiny tick 2. SIZE of TICK: "How big is the tick?" (e.g., size of poppy seed, apple seed, watermelon seed; unsure) Note: Deer ticks can be the size of a poppy seed (nymph) or an apple seed (adult).       Little bigger than poppy seed 3. ENGORGED: "Did the tick look flat or engorged (full, swollen)?" (e.g., flat, engorged; unsure)     flat 4. LOCATION: "Where is the tick bite located?"      Upper left arm 5. ONSET: "How long do you think the tick was attached before you removed it?" (e.g., 5 hours, 2 days)      Under 24 6. APPEARANCE of BITE or RASH: "What does the site look like?"     Clear- no sign 7. PREGNANCY: "Is there any chance you are pregnant?" "When was your last menstrual period?"  Protocols used: Tick Bite-A-AH

## 2021-07-11 NOTE — Telephone Encounter (Signed)
  Chief Complaint: tickbite- white spot on tick, very small, attached less than 24 hours, no redness or sign of irritation at bite site Symptoms: none Frequency: found and removed this morning Pertinent Negatives: Patient denies fever, irritation, rash Disposition: [] ED /[] Urgent Care (no appt availability in office) / [] Appointment(In office/virtual)/ []  [place] [facility]/ [x] Home Care/ [] Refused Recommended Disposition /[] [place] [facility]/ []  Follow-up with PCP Additional Notes: Patient instructed to call back if she has rash, fever, flu symptoms , ring at bite site. Advised will send message to provider for review and any other recommendations

## 2022-01-02 ENCOUNTER — Ambulatory Visit: Payer: Medicare PPO | Admitting: Physician Assistant

## 2022-01-02 ENCOUNTER — Encounter: Payer: Self-pay | Admitting: Physician Assistant

## 2022-01-02 VITALS — BP 116/77 | HR 83 | Temp 98.6°F | Wt 130.4 lb

## 2022-01-02 DIAGNOSIS — K130 Diseases of lips: Secondary | ICD-10-CM

## 2022-01-02 MED ORDER — CLOTRIMAZOLE 1 % EX CREA: 1.0000 | TOPICAL_CREAM | Freq: Two times a day (BID) | CUTANEOUS | 0 refills | Status: DC

## 2022-01-02 NOTE — Progress Notes (Signed)
      Established patient visit   Patient: Vanessa Dominguez   DOB: September 14, 1945   76 y.o. Female  MRN: [id number] Visit Date: 01/02/2022  Today's healthcare provider: [name], [name]  I,Baylin Gamblin,acting as a scribe for [name], [name].,have documented all relevant documentation on the behalf of [name], [name],as directed by  [name], [name] while in the presence of [name], [name].  Cc.  Subjective     Pt reports dry, cracked lips, bleeding and oozing. Has been using aquaphor and vaseline otc with only small improvement. Denies precipitating factor.  Medications: Outpatient Medications Prior to Visit  Medication Sig   Calcium Carbonate (CALCIUM 600 PO) Take 1 tablet by mouth daily.   gabapentin (NEURONTIN) 300 MG capsule TAKE 1 CAPSULE 3 TIMES DAILY   magnesium 30 MG tablet Take 30 mg by mouth 2 (two) times daily.   omeprazole (PRILOSEC) 20 MG capsule Take 1 capsule (20 mg total) by mouth daily.   No facility-administered medications prior to visit.    Review of Systems  All other systems reviewed and are negative.    Objective    Blood pressure 116/77, pulse 83, temperature 98.6 F (37 C), temperature source Oral, weight 130 lb 6.4 oz (59.1 kg), SpO2 100 %.   Physical Exam Vitals reviewed.  Constitutional:      Appearance: She is not ill-appearing.  HENT:     Head: Normocephalic.  Eyes:     Conjunctiva/sclera: Conjunctivae normal.  Cardiovascular:     Rate and Rhythm: Normal rate.  Pulmonary:     Effort: Pulmonary effort is normal. No respiratory distress.  Skin:    Comments: Dry, cracked lips. No exudate/discharge seen. Surrounding oral erythema  Neurological:     General: No focal deficit present.     Mental Status: She is alert and oriented to person, place, and time.  Psychiatric:        Mood and Affect: Mood normal.        Behavior: Behavior normal.     No results found for any visits on 01/02/22.  Assessment & Plan      Cheilitis Will tx w/ clotrimazole advised along periphery of lips. If no improvement call office and can try a steroid. Continue w/ aquaphor/vaseline  Return if symptoms worsen or fail to improve.     I, [name], [name] have reviewed all documentation for this visit. The documentation on  01/02/2022 for the exam, diagnosis, procedures, and orders are all accurate and complete.  [name], [name] [facility] [address] [place], [place], [address] Office: [phone] Fax: [phone]   [facility]

## 2022-01-11 ENCOUNTER — Encounter: Payer: Self-pay | Admitting: Physician Assistant

## 2022-01-11 ENCOUNTER — Ambulatory Visit: Payer: Medicare PPO | Admitting: Physician Assistant

## 2022-01-11 VITALS — BP 140/76 | HR 69 | Wt 131.5 lb

## 2022-01-11 DIAGNOSIS — K130 Diseases of lips: Secondary | ICD-10-CM | POA: Diagnosis not present

## 2022-01-11 MED ORDER — AMOXICILLIN 875 MG PO TABS
875.0000 mg | ORAL_TABLET | Freq: Two times a day (BID) | ORAL | 0 refills | Status: AC
Start: 2022-01-11 — End: 2022-01-18

## 2022-01-11 NOTE — Progress Notes (Signed)
     I,[name],acting as a Neurosurgeon for [employer], [name].,have documented all relevant documentation on the behalf of Vanessa Ferguson, [name],as directed by  Vanessa Ferguson, [name] while in the presence of Vanessa Ferguson, [name].   Established patient visit   Patient: Vanessa Dominguez   DOB: [birth year]   76 y.o. Female  MRN: [id number] Visit Date: 01/11/2022  Today's healthcare provider: [name], [name]   Cc. Bottom lip swelling x 2 days  Subjective    HPI   Follow up for cheilitis  The patient was last seen for this 9 days ago. Changes made at last visit include tx w/ clotrimazole advised along periphery of lips. Continue w/ aquaphor/vaseline. Reports initial improvement but then   She reports excellent compliance with treatment. She feels that condition is Unchanged. She is not having side effects.   -----------------------------------------------------------------------------------------   Medications: Outpatient Medications Prior to Visit  Medication Sig   Calcium Carbonate (CALCIUM 600 PO) Take 1 tablet by mouth daily.   clotrimazole (CLOTRIMAZOLE ANTI-FUNGAL) 1 % cream Apply 1 Application topically 2 (two) times daily.   gabapentin (NEURONTIN) 300 MG capsule TAKE 1 CAPSULE 3 TIMES DAILY   magnesium 30 MG tablet Take 30 mg by mouth 2 (two) times daily.   omeprazole (PRILOSEC) 20 MG capsule Take 1 capsule (20 mg total) by mouth daily.   No facility-administered medications prior to visit.    Review of Systems  Constitutional:  Negative for fatigue and fever.  HENT:         Lip swelling  Respiratory:  Negative for cough and shortness of breath.   Cardiovascular:  Negative for chest pain and leg swelling.  Gastrointestinal:  Negative for abdominal pain.  Neurological:  Negative for dizziness and headaches.      Objective    Blood pressure (!) 140/76, pulse 69, weight 131 lb 8 oz (59.6 kg).   Physical Exam Vitals reviewed.  Constitutional:       Appearance: She is not ill-appearing.  HENT:     Head: Normocephalic.     Mouth/Throat:     Comments: Bottom lip with erythema swelling, and yellow discharge Eyes:     Conjunctiva/sclera: Conjunctivae normal.  Cardiovascular:     Rate and Rhythm: Normal rate.  Pulmonary:     Effort: Pulmonary effort is normal. No respiratory distress.  Neurological:     General: No focal deficit present.     Mental Status: She is alert and oriented to person, place, and time.  Psychiatric:        Mood and Affect: Mood normal.        Behavior: Behavior normal.     No results found for any visits on 01/11/22.  Assessment & Plan     Lip cellulitis Rx amoxicillin 875 mg bid x 7 days I think her ongoing issues are 2/2 tanning and the UV exposure, pt does not want to stop suntanning. Advised she use a chapstick with spf.  Ref to derm for risk of skin cancer  Return if symptoms worsen or fail to improve.      I, Vanessa Ferguson, [name] have reviewed all documentation for this visit. The documentation on  01/11/2022 for the exam, diagnosis, procedures, and orders are all accurate and complete.  Vanessa Ferguson, [name] [facility] [address] [place], [place], [address] Office: [phone] Fax: [phone]    [facility]

## 2022-01-15 ENCOUNTER — Other Ambulatory Visit: Payer: Self-pay | Admitting: Physician Assistant

## 2022-01-15 DIAGNOSIS — K130 Diseases of lips: Secondary | ICD-10-CM

## 2022-01-17 ENCOUNTER — Ambulatory Visit: Payer: Self-pay | Admitting: *Deleted

## 2022-01-17 NOTE — Telephone Encounter (Signed)
  Chief Complaint: lower lip swelling and soreness did not respond to 1st round of Amoxicillin.   Requesting another round of the antibiotic. Symptoms: Lower lip remains swollen and sore Frequency: Ongoing issue Pertinent Negatives: Patient denies lip being any better with the antibiotic. Disposition: [] ED /[] Urgent Care (no appt availability in office) / [] Appointment(In office/virtual)/ []  [place] [facility]/ [] Home Care/ [] Refused Recommended Disposition /[] [place] [facility]/ [x]  Follow-up with PCP Additional Notes: Message sent to [facility] to , [name] with her request.

## 2022-01-17 NOTE — Telephone Encounter (Signed)
Message from [place] sent at 01/17/2022 11:56 AM EST  Summary: requesting another round of amoxicillin   Pt states her mouth is not better and is requesting another round of amoxicillin  Provider refused refill request  Please assist further          Call History   Type Contact Phone/Fax User  01/17/2022 11:55 AM EST Phone (Incoming) Bares, [name] [phone] Judie Petit) Donnelly Angelica    Reason for Disposition  Lip swelling is a chronic symptom (recurrent or ongoing AND present > 4 weeks)  Answer Assessment - Initial Assessment Questions 1. ONSET: "When did the swelling start?" (e.g., minutes, hours, days)     I've seen Vanessa Dominguez about this sore on my lip.    (Seen 01/11/2022 and prescribed Amoxicillin for 7 days).    It's not any better so I'm wondering if she will prescribe another round of the Amoxicillin? 2. SEVERITY: "How swollen is it?"     Lip is swollen for 2 days really it's not gotten any better with the Amoxicillin. 3. ITCHING: "Is there any itching?" If Yes, ask: "How much?"   (Scale 1-10; mild, moderate or severe)     No 4. PAIN: "Is the swelling painful to touch?" If Yes, ask: "How painful is it?"   (Scale 1-10; mild, moderate or severe)     Soreness 5. CAUSE: "What do you think is causing the lip swelling?"     Not asked    This has been an ongoing problem 6. RECURRENT SYMPTOM: "Have you had lip swelling before?" If Yes, ask: "When was the last time?" "What happened that time?"     Yes  Lower lip has a sore that did not respond to the antibiotic. 7. OTHER SYMPTOMS: "Do you have any other symptoms?" (e.g., toothache)     No 8. PREGNANCY: "Is there any chance you are pregnant?" "When was your last menstrual period?"     N/A  Protocols used: Lip Swelling-A-AH

## 2022-01-18 ENCOUNTER — Telehealth: Payer: Self-pay

## 2022-01-18 DIAGNOSIS — L0100 Impetigo, unspecified: Secondary | ICD-10-CM | POA: Diagnosis not present

## 2022-01-18 DIAGNOSIS — B001 Herpesviral vesicular dermatitis: Secondary | ICD-10-CM | POA: Diagnosis not present

## 2022-01-18 NOTE — Telephone Encounter (Signed)
Called patient to make appointment. LMTCB. Okay for PEC to advise.

## 2022-01-18 NOTE — Telephone Encounter (Signed)
Attempted to reach pt, left VM to call back. 

## 2022-01-18 NOTE — Telephone Encounter (Signed)
Copied from CRM [id number]. Topic: General - Other >> Jan 18, 2022 12:39 PM Tiffany B wrote: Reason for [id number] reference 01/18/2022 NT encounter. Patient returning nurse call and stated she went to [facility] and medication was prescribed. Patient will try new medication.

## 2022-01-18 NOTE — Telephone Encounter (Signed)
Noted ty

## 2022-01-18 NOTE — Telephone Encounter (Signed)
Pt called back, advised Tiffany, agent that pt just needed to be scheduled for OV with different for provider for 2nd opinion. Tiffany verbalized understanding. Placed CRM letting provider know pt went to [facility] and was prescribed medication and will see if that helps. No further assistance needed.

## 2022-02-27 DIAGNOSIS — M4316 Spondylolisthesis, lumbar region: Secondary | ICD-10-CM | POA: Diagnosis not present

## 2022-02-27 DIAGNOSIS — M48061 Spinal stenosis, lumbar region without neurogenic claudication: Secondary | ICD-10-CM | POA: Diagnosis not present

## 2022-04-17 ENCOUNTER — Ambulatory Visit (INDEPENDENT_AMBULATORY_CARE_PROVIDER_SITE_OTHER): Payer: Medicare PPO

## 2022-04-17 VITALS — Ht 66.0 in | Wt 131.0 lb

## 2022-04-17 DIAGNOSIS — Z0000 Encounter for general adult medical examination without abnormal findings: Secondary | ICD-10-CM | POA: Diagnosis not present

## 2022-04-17 NOTE — Patient Instructions (Signed)
[name] , Thank you for taking time to come for your Medicare Wellness Visit. I appreciate your ongoing commitment to your health goals. Please review the following plan we discussed and let me know if I can assist you in the future.   These are the goals we discussed:  Goals      DIET - EAT MORE FRUITS AND VEGETABLES        This is a list of the screening recommended for you and due dates:  Health Maintenance  Topic Date Due   COVID-19 Vaccine (5 - 2023-24 season) 10/06/2021   Mammogram  01/12/2023*   Stool Blood Test  05/03/2022   Medicare Annual Wellness Visit  04/17/2023   DTaP/Tdap/Td vaccine (2 - Td or Tdap) 07/03/2027   Pneumonia Vaccine  Completed   Flu Shot  Completed   DEXA scan (bone density measurement)  Completed   Hepatitis C Screening: USPSTF Recommendation to screen - Ages 63-79 yo.  Completed   Zoster (Shingles) Vaccine  Completed   HPV Vaccine  Aged Out   Colon Cancer Screening  Discontinued  *Topic was postponed. The date shown is not the original due date.    Advanced directives: yes  Conditions/risks identified: none  Next appointment: Follow up in one year for your annual wellness visit 04/22/2023 '@11'$ :15 am telephone   Preventive Care 65 Years and Older, Female Preventive care refers to lifestyle choices and visits with your health care provider that can promote health and wellness. What does preventive care include? A yearly physical exam. This is also called an annual well check. Dental exams once or twice a year. Routine eye exams. Ask your health care provider how often you should have your eyes checked. Personal lifestyle choices, including: Daily care of your teeth and gums. Regular physical activity. Eating a healthy diet. Avoiding tobacco and drug use. Limiting alcohol use. Practicing safe sex. Taking low-dose aspirin every day. Taking vitamin and mineral supplements as recommended by your health care provider. What happens during an  annual well check? The services and screenings done by your health care provider during your annual well check will depend on your age, overall health, lifestyle risk factors, and family history of disease. Counseling  Your health care provider may ask you questions about your: Alcohol use. Tobacco use. Drug use. Emotional well-being. Home and relationship well-being. Sexual activity. Eating habits. History of falls. Memory and ability to understand (cognition). Work and work Statistician. Reproductive health. Screening  You may have the following tests or measurements: Height, weight, and BMI. Blood pressure. Lipid and cholesterol levels. These may be checked every 5 years, or more frequently if you are over 89 years old. Skin check. Lung cancer screening. You may have this screening every year starting at age 57 if you have a 30-pack-year history of smoking and currently smoke or have quit within the past 15 years. Fecal occult blood test (FOBT) of the stool. You may have this test every year starting at age 37. Flexible sigmoidoscopy or colonoscopy. You may have a sigmoidoscopy every 5 years or a colonoscopy every 10 years starting at age 92. Hepatitis C blood test. Hepatitis B blood test. Sexually transmitted disease (STD) testing. Diabetes screening. This is done by checking your blood sugar (glucose) after you have not eaten for a while (fasting). You may have this done every 1-3 years. Bone density scan. This is done to screen for osteoporosis. You may have this done starting at age 71. Mammogram. This may be  done every 1-2 years. Talk to your health care provider about how often you should have regular mammograms. Talk with your health care provider about your test results, treatment options, and if necessary, the need for more tests. Vaccines  Your health care provider may recommend certain vaccines, such as: Influenza vaccine. This is recommended every year. Tetanus,  diphtheria, and acellular pertussis (Tdap, Td) vaccine. You may need a Td booster every 10 years. Zoster vaccine. You may need this after age 62. Pneumococcal 13-valent conjugate (PCV13) vaccine. One dose is recommended after age 25. Pneumococcal polysaccharide (PPSV23) vaccine. One dose is recommended after age 69. Talk to your health care provider about which screenings and vaccines you need and how often you need them. This information is not intended to replace advice given to you by your health care provider. Make sure you discuss any questions you have with your health care provider. Document Released: 02/18/2015 Document Revised: 10/12/2015 Document Reviewed: 11/23/2014 Elsevier Interactive Patient Education  2017 [employer] Prevention in the Home Falls can cause injuries. They can happen to people of all ages. There are many things you can do to make your home safe and to help prevent falls. What can I do on the outside of my home? Regularly fix the edges of walkways and driveways and fix any cracks. Remove anything that might make you trip as you walk through a door, such as a raised step or threshold. Trim any bushes or trees on the path to your home. Use bright outdoor lighting. Clear any walking paths of anything that might make someone trip, such as rocks or tools. Regularly check to see if handrails are loose or broken. Make sure that both sides of any steps have handrails. Any raised decks and porches should have guardrails on the edges. Have any leaves, snow, or ice cleared regularly. Use sand or salt on walking paths during winter. Clean up any spills in your garage right away. This includes oil or grease spills. What can I do in the bathroom? Use night lights. Install grab bars by the toilet and in the tub and shower. Do not use towel bars as grab bars. Use non-skid mats or decals in the tub or shower. If you need to sit down in the shower, use a plastic,  non-slip stool. Keep the floor dry. Clean up any water that spills on the floor as soon as it happens. Remove soap buildup in the tub or shower regularly. Attach bath mats securely with double-sided non-slip rug tape. Do not have throw rugs and other things on the floor that can make you trip. What can I do in the bedroom? Use night lights. Make sure that you have a light by your bed that is easy to reach. Do not use any sheets or blankets that are too big for your bed. They should not hang down onto the floor. Have a firm chair that has side arms. You can use this for support while you get dressed. Do not have throw rugs and other things on the floor that can make you trip. What can I do in the kitchen? Clean up any spills right away. Avoid walking on wet floors. Keep items that you use a lot in easy-to-reach places. If you need to reach something above you, use a strong step stool that has a grab bar. Keep electrical cords out of the way. Do not use floor polish or wax that makes floors slippery. If you must use wax,  use non-skid floor wax. Do not have throw rugs and other things on the floor that can make you trip. What can I do with my stairs? Do not leave any items on the stairs. Make sure that there are handrails on both sides of the stairs and use them. Fix handrails that are broken or loose. Make sure that handrails are as long as the stairways. Check any carpeting to make sure that it is firmly attached to the stairs. Fix any carpet that is loose or worn. Avoid having throw rugs at the top or bottom of the stairs. If you do have throw rugs, attach them to the floor with carpet tape. Make sure that you have a light switch at the top of the stairs and the bottom of the stairs. If you do not have them, ask someone to add them for you. What else can I do to help prevent falls? Wear shoes that: Do not have high heels. Have rubber bottoms. Are comfortable and fit you well. Are closed  at the toe. Do not wear sandals. If you use a stepladder: Make sure that it is fully opened. Do not climb a closed stepladder. Make sure that both sides of the stepladder are locked into place. Ask someone to hold it for you, if possible. Clearly mark and make sure that you can see: Any grab bars or handrails. First and last steps. Where the edge of each step is. Use tools that help you move around (mobility aids) if they are needed. These include: Canes. Walkers. Scooters. Crutches. Turn on the lights when you go into a dark area. Replace any light bulbs as soon as they burn out. Set up your furniture so you have a clear path. Avoid moving your furniture around. If any of your floors are uneven, fix them. If there are any pets around you, be aware of where they are. Review your medicines with your doctor. Some medicines can make you feel dizzy. This can increase your chance of falling. Ask your doctor what other things that you can do to help prevent falls. This information is not intended to replace advice given to you by your health care provider. Make sure you discuss any questions you have with your health care provider. Document Released: 11/18/2008 Document Revised: 06/30/2015 Document Reviewed: 02/26/2014 Elsevier Interactive Patient Education  2017 [employer].

## 2022-04-17 NOTE — Progress Notes (Signed)
I connected with  [name] on 04/17/22 by a audio enabled telemedicine application and verified that I am speaking with the correct person using two identifiers.  Patient Location: Home  Provider Location: Home Office  I discussed the limitations of evaluation and management by telemedicine. The patient expressed understanding and agreed to proceed.  Subjective:   Vanessa Dominguez is a 77 y.o. female who presents for Medicare Annual (Subsequent) preventive examination.  Review of Systems    Cardiac Risk Factors include: advanced age (>75mn, >>7women);dyslipidemia    Objective:    Today's Vitals   04/17/22 1102  Weight: 131 lb (59.4 kg)  Height: '5\' 6"'$  (1.676 m)   Body mass index is 21.14 kg/m.     04/17/2022   11:11 AM 04/12/2021   10:50 AM 03/15/2016    9:07 AM 12/26/2015   12:06 PM 12/13/2015   11:04 AM  Advanced Directives  Does Patient Have a Medical Advance Directive? Yes No Yes No Yes  Type of AProduction managerof ABarnsdallLiving will  Does patient want to make changes to medical advance directive?     No - Patient declined  Copy of [place]ossm[place]in Chart?     No - copy requested  Would patient like information on creating a medical advance directive?  No - Patient declined       Current Medications (verified) Outpatient Encounter Medications as of 04/17/2022  Medication Sig   Calcium Carbonate (CALCIUM 600 PO) Take 1 tablet by mouth daily.   clotrimazole (CLOTRIMAZOLE ANTI-FUNGAL) 1 % cream Apply 1 Application topically 2 (two) times daily.   gabapentin (NEURONTIN) 300 MG capsule TAKE 1 CAPSULE 3 TIMES DAILY   magnesium 30 MG tablet Take 30 mg by mouth 2 (two) times daily.   omeprazole (PRILOSEC) 20 MG capsule Take 1 capsule (20 mg total) by mouth daily.   No facility-administered encounter medications on file as of 04/17/2022.    Allergies (verified) Patient has no known allergies.   History: Past Medical History:   Diagnosis Date   Arthritis    Pneumonia    Past Surgical History:  Procedure Laterality Date   CATARACT EXTRACTION W/PHACO Left 12/13/2015   Procedure: CATARACT EXTRACTION PHACO AND INTRAOCULAR LENS PLACEMENT ([place];  Surgeon: WBirder Robson MD;  Location: [facility] ORS;  Service: Ophthalmology;  Laterality: Left;  Lot# [id number] US:00:36.0 AP%: 23.7 CDE: 8.52   CATARACT EXTRACTION W/PHACO Right 01/10/2016   Procedure: CATARACT EXTRACTION PHACO AND INTRAOCULAR LENS PLACEMENT ([place];  Surgeon: WBirder Robson MD;  Location: [facility] ORS;  Service: Ophthalmology;  Laterality: Right;  Lot # [id number] [place] 00:38.4 AP%: 21.3 CDE: 8.17   CESAREAN SECTION     JOINT REPLACEMENT Left 2006   Lt knee   KNEE SURGERY Left [phone]  TUBAL LIGATION  1989   Family History  Problem Relation Age of Onset   Heart attack Brother        10/2016   Breast cancer Neg Hx    Social History   Socioeconomic History   Marital status: Married    Spouse name: Not on file   Number of children: Not on file   Years of education: Not on file   Highest education level: Not on file  Occupational History   Not on file  Tobacco Use   Smoking status: Former   Smokeless tobacco: Never  Substance and Sexual Activity   Alcohol use: No   Drug use: No  Sexual activity: Not on file  Other Topics Concern   Not on file  Social History Narrative   Not on file   Social Determinants of Health   Financial Resource Strain: Low Risk  (04/17/2022)   Overall Financial Resource Strain (CARDIA)    Difficulty of Paying Living Expenses: Not hard at all  Food Insecurity: No Food Insecurity (04/17/2022)   Hunger Vital Sign    Worried About Running Out of Food in the Last Year: Never true    Ran Out of Food in the Last Year: Never true  Transportation Needs: No Transportation Needs (04/17/2022)   PRAPARE - Hydrologist (Medical): No    Lack of Transportation (Non-Medical): No  Physical  Activity: Sufficiently Active (04/17/2022)   Exercise Vital Sign    Days of Exercise per Week: 7 days    Minutes of Exercise per Session: 60 min  Stress: No Stress Concern Present (04/17/2022)   [place]    Feeling of Stress : Not at all  Social Connections: Moderately Isolated (04/17/2022)   Social Connection and Isolation Panel [NHANES]    Frequency of Communication with Friends and Family: More than three times a week    Frequency of Social Gatherings with Friends and Family: More than three times a week    Attends Religious Services: Never    Marine scientist or Organizations: No    Attends Music therapist: Never    Marital Status: Married    Tobacco Counseling Counseling given: Not Answered   Clinical Intake:  Pre-visit preparation completed: Yes  Pain : No/denies pain     BMI - recorded: 21.14 Nutritional Status: BMI of 19-24  Normal Nutritional Risks: None Diabetes: No  How often do you need to have someone help you when you read instructions, pamphlets, or other written materials from your doctor or pharmacy?: 1 - Never  Diabetic?no  Interpreter Needed?: No  Comments: lives husband and dog Information entered by :: B.Ladaysha Soutar,LPN   Activities of Daily Living    04/17/2022   11:11 AM 01/02/2022    1:28 PM  In your present state of health, do you have any difficulty performing the following activities:  Hearing? 0 0  Vision? 0 0  Difficulty concentrating or making decisions? 0 0  Walking or climbing stairs? 0 0  Dressing or bathing? 0 0  Doing errands, shopping? 0 0  Preparing Food and eating ? N   Using the Toilet? N   In the past six months, have you accidently leaked urine? N   Do you have problems with loss of bowel control? N   Managing your Medications? N   Managing your Finances? N   Housekeeping or managing your Housekeeping? N     Patient Care  Team: Emelia Loron as PCP - General (Physician Assistant) Newman Pies, MD as Consulting Physician (Neurosurgery)  Indicate any recent Medical Services you may have received from other than [facility] providers in the past year (date may be approximate).     Assessment:   This is a routine wellness examination for [place].  Hearing/Vision screen Hearing Screening - Comments:: Adequate hearing Vision Screening - Comments:: Adequate vision [facility] Carolan Clines  Dietary issues and exercise activities discussed: Current Exercise Habits: Structured exercise class, Type of exercise: walking, Time (Minutes): 60, Frequency (Times/Week): 7, Weekly Exercise (Minutes/Week): 420, Exercise limited by: None identified   Goals Addressed  This Visit's Progress    DIET - EAT MORE FRUITS AND VEGETABLES   On track      Depression Screen    04/17/2022   11:08 AM 01/02/2022    1:28 PM 04/12/2021   10:48 AM 04/20/2020   11:26 AM 09/07/2019    9:10 AM 09/04/2018    9:02 AM 02/19/2017    9:58 AM  PHQ 2/9 Scores  PHQ - 2 Score 0 0 0 0 1 0 0  PHQ- 9 Score  2  3       Fall Risk    04/17/2022   11:05 AM 01/02/2022    1:27 PM 04/12/2021   10:50 AM 04/20/2020   11:25 AM 09/07/2019    9:10 AM  Fall Risk   Falls in the past year? 0 1 0 0 0  Number falls in past yr: 1 0 0 0 0  Injury with Fall? 0 0 0 0 0  Risk for fall due to : Orthopedic patient History of fall(s) No Fall Risks  No Fall Risks  Risk for fall due to: Comment spinal stenosis      Follow up Education provided;Falls prevention discussed  Falls evaluation completed  Falls evaluation completed    FALL RISK PREVENTION PERTAINING TO THE HOME:  Any stairs in or around the home? Yes  If so, are there any without handrails? Yes  Home free of loose throw rugs in walkways, pet beds, electrical cords, etc? Yes  Adequate lighting in your home to reduce risk of falls? Yes   ASSISTIVE DEVICES UTILIZED TO PREVENT  FALLS:  Life alert? No  Use of a cane, walker or w/c? No  Grab bars in the bathroom? Yes  Shower chair or bench in shower? Yes  Elevated toilet seat or a handicapped toilet? No    Cognitive Function:        04/17/2022   11:17 AM 09/07/2019    9:14 AM 09/04/2018    9:12 AM  6CIT Screen  What Year? 0 points 0 points 0 points  What month? 0 points 0 points 0 points  What time? 0 points 0 points 0 points  Count back from 20 0 points 0 points 0 points  Months in reverse 0 points 0 points 0 points  Repeat phrase 0 points 2 points 0 points  Total Score 0 points 2 points 0 points    Immunizations Immunization History  Administered Date(s) Administered   Fluad Quad(high Dose 65+) 12/18/2021   Influenza, High Dose Seasonal PF 03/15/2016, 10/16/2017   Influenza,inj,Quad PF,6+ Mos 02/09/2015   Influenza-Unspecified 11/09/2016, 11/18/2018, 11/17/2019, 12/06/2020   Moderna Covid-19 Vaccine Bivalent Booster 54yr & up 04/30/2019   Moderna Sars-Covid-2 Vaccination 04/02/2019, 04/30/2019   PFIZER Comirnaty(Gray Top)Covid-19 Tri-Sucrose Vaccine 12/24/2019   Pneumococcal Conjugate-13 02/25/2017   Pneumococcal Polysaccharide-23 02/09/2015   Tdap 07/02/2017   Zoster Recombinat (Shingrix) 10/16/2017, 12/16/2017   Zoster, Live 07/30/2012    TDAP status: Due, Education has been provided regarding the importance of this vaccine. Advised may receive this vaccine at local pharmacy or Health Dept. Aware to provide a copy of the vaccination record if obtained from local pharmacy or Health Dept. Verbalized acceptance and understanding.  Flu Vaccine status: Declined, Education has been provided regarding the importance of this vaccine but patient still declined. Advised may receive this vaccine at local pharmacy or Health Dept. Aware to provide a copy of the vaccination record if obtained from local pharmacy or Health Dept. Verbalized acceptance and understanding.  Pneumococcal vaccine status: Up to  date  Covid-19 vaccine status: Completed vaccines  Qualifies for Shingles Vaccine? Yes   Zostavax completed Yes   Shingrix Completed?: Yes  Screening Tests Health Maintenance  Topic Date Due   COVID-19 Vaccine (5 - 2023-24 season) 10/06/2021   MAMMOGRAM  01/12/2023 (Originally 10/04/2021)   [place] FOBT  05/03/2022   Medicare Annual Wellness (AWV)  04/17/2023   DTaP/Tdap/Td (2 - Td or Tdap) 07/03/2027   Pneumonia Vaccine 25+ Years old  Completed   INFLUENZA VACCINE  Completed   DEXA SCAN  Completed   Hepatitis C Screening  Completed   Zoster Vaccines- Shingrix  Completed   HPV VACCINES  Aged Out   COLONOSCOPY (Pts 45-27yr Insurance coverage will need to be confirmed)  Discontinued    Health Maintenance  Health Maintenance Due  Topic Date Due   COVID-19 Vaccine (5 - 2023-24 season) 10/06/2021    Colorectal cancer screening: Type of screening: Cologuard. Completed yes. Repeat every - yearsage out this year  Mammogram status: Completed yes. Repeat every year  Bone Density status: Completed yes. Results reflect: Bone density results: NORMAL. Repeat every 5 years.  Lung Cancer Screening: (Low Dose CT Chest recommended if Age 75-80years, 30 pack-year currently smoking OR have quit w/in 15years.) does not qualify.   Lung Cancer Screening Referral: no  Additional Screening:  Hepatitis C Screening: does not qualify; Completed yes  Vision Screening: Recommended annual ophthalmology exams for early detection of glaucoma and other disorders of the eye. Is the patient up to date with their annual eye exam?  Yes  Who is the provider or what is the name of the office in which the patient attends annual eye exams? Dr PCarolan ClinesIf pt is not established with a provider, would they like to be referred to a provider to establish care? No .   Dental Screening: Recommended annual dental exams for proper oral hygiene  Community Resource Referral / Chronic Care  Management: CRR required this visit?  No   CCM required this visit?  No      Plan:     I have personally reviewed and noted the following in the patient's chart:   Medical and social history Use of alcohol, tobacco or illicit drugs  Current medications and supplements including opioid prescriptions. Patient is not currently taking opioid prescriptions. Functional ability and status Nutritional status Physical activity Advanced directives List of other physicians Hospitalizations, surgeries, and ER visits in previous 12 months Vitals Screenings to include cognitive, depression, and falls Referrals and appointments  In addition, I have reviewed and discussed with patient certain preventive protocols, quality metrics, and best practice recommendations. A written personalized care plan for preventive services as well as general preventive health recommendations were provided to patient.     [facility] LPN   [id number]  Nurse Notes: pt is doing very well as she remains active walking 2 miles every day and eating healthy for the most part. Pt has no concerns or questions at this time.

## 2022-04-27 NOTE — Progress Notes (Unsigned)
I,[name],acting as a Education administrator for [employer], [name].,have documented all relevant documentation on the behalf of Mikey Kirschner, [name],as directed by  Mikey Kirschner, [name] while in the presence of Mikey Kirschner, [name].   Complete physical exam   Patient: Vanessa Dominguez   DOB: [birth year]   77 y.o. Female  MRN: [id number] Visit Date: 04/30/2022  Today's healthcare provider: Mikey Kirschner, [name]   Cc. cpe  Subjective    Vanessa Dominguez is a 77 y.o. female who presents today for a complete physical exam.  She reports consuming a general diet.  The patient reports going walking 2 miles a day.  She generally feels well. She reports sleeping fairly well. She does not have additional problems to discuss today.  HPI    Past Medical History:  Diagnosis Date   Arthritis    Pneumonia    Past Surgical History:  Procedure Laterality Date   CATARACT EXTRACTION W/PHACO Left 12/13/2015   Procedure: CATARACT EXTRACTION PHACO AND INTRAOCULAR LENS PLACEMENT ([place]);  Surgeon: Birder Robson, MD;  Location: [facility] ORS;  Service: Ophthalmology;  Laterality: Left;  Lot# [id number] H US:00:36.0 AP%: 23.7 CDE: 8.52   CATARACT EXTRACTION W/PHACO Right 01/10/2016   Procedure: CATARACT EXTRACTION PHACO AND INTRAOCULAR LENS PLACEMENT ([place]);  Surgeon: Birder Robson, MD;  Location: [facility] ORS;  Service: Ophthalmology;  Laterality: Right;  Lot # [id number] H [place]: 00:38.4 AP%: 21.3 CDE: 8.17   CESAREAN SECTION     JOINT REPLACEMENT Left 2006   Lt knee   KNEE SURGERY Left [phone]   TUBAL LIGATION  1989   Social History   Socioeconomic History   Marital status: Married    Spouse name: Not on file   Number of children: Not on file   Years of education: Not on file   Highest education level: Not on file  Occupational History   Not on file  Tobacco Use   Smoking status: Former   Smokeless tobacco: Never  Substance and Sexual Activity   Alcohol use: No   Drug use: No   Sexual activity:  Not on file  Other Topics Concern   Not on file  Social History Narrative   Not on file   Social Determinants of Health   Financial Resource Strain: Low Risk  (04/17/2022)   Overall Financial Resource Strain (CARDIA)    Difficulty of Paying Living Expenses: Not hard at all  Food Insecurity: No Food Insecurity (04/17/2022)   Hunger Vital Sign    Worried About Running Out of Food in the Last Year: Never true    Ran Out of Food in the Last Year: Never true  Transportation Needs: No Transportation Needs (04/17/2022)   PRAPARE - Hydrologist (Medical): No    Lack of Transportation (Non-Medical): No  Physical Activity: Sufficiently Active (04/17/2022)   Exercise Vital Sign    Days of Exercise per Week: 7 days    Minutes of Exercise per Session: 60 min  Stress: No Stress Concern Present (04/17/2022)   [place]    Feeling of Stress : Not at all  Social Connections: Moderately Isolated (04/17/2022)   Social Connection and Isolation Panel [NHANES]    Frequency of Communication with Friends and Family: More than three times a week    Frequency of Social Gatherings with Friends and Family: More than three times a week    Attends Religious Services: Never    Marine scientist or  Organizations: No    Attends Archivist Meetings: Never    Marital Status: Married  Human resources officer Violence: Not At Risk (04/17/2022)   Humiliation, Afraid, Rape, and Kick questionnaire    Fear of Current or Ex-Partner: No    Emotionally Abused: No    Physically Abused: No    Sexually Abused: No   Family Status  Relation Name Status   Mother  Deceased at age 69   Father  Deceased at age 47       Died from cancer   Brother  Deceased   Neg Hx  (Not Specified)   Family History  Problem Relation Age of Onset   Heart attack Brother        10/2016   Breast cancer Neg Hx    No Known Allergies  Patient  Care Team: Mikey Kirschner, [name] as PCP - General (Physician Assistant) Newman Pies, MD as Consulting Physician (Neurosurgery)   Medications: Outpatient Medications Prior to Visit  Medication Sig   gabapentin (NEURONTIN) 300 MG capsule TAKE 1 CAPSULE 3 TIMES DAILY   omeprazole (PRILOSEC) 20 MG capsule Take 1 capsule (20 mg total) by mouth daily.   [DISCONTINUED] Calcium Carbonate (CALCIUM 600 PO) Take 1 tablet by mouth daily. (Patient not taking: Reported on 04/30/2022)   [DISCONTINUED] clotrimazole (CLOTRIMAZOLE ANTI-FUNGAL) 1 % cream Apply 1 Application topically 2 (two) times daily. (Patient not taking: Reported on 04/30/2022)   [DISCONTINUED] magnesium 30 MG tablet Take 30 mg by mouth 2 (two) times daily. (Patient not taking: Reported on 04/30/2022)   No facility-administered medications prior to visit.    Review of Systems  Musculoskeletal:  Positive for arthralgias.    Objective    BP 137/78 (BP Location: Left Arm, Patient Position: Sitting, Cuff Size: Normal)   Pulse 73   Ht 5\' 6"  (1.676 m)   Wt 137 lb 6.4 oz (62.3 kg)   SpO2 100%   BMI 22.18 kg/m     Physical Exam Constitutional:      General: She is awake.     Appearance: She is well-developed. She is not ill-appearing.  HENT:     Head: Normocephalic.     Right Ear: Tympanic membrane normal.     Left Ear: Tympanic membrane normal.     Nose: Nose normal. No congestion or rhinorrhea.     Mouth/Throat:     Pharynx: No oropharyngeal exudate or posterior oropharyngeal erythema.  Eyes:     Conjunctiva/sclera: Conjunctivae normal.     Pupils: Pupils are equal, round, and reactive to light.  Cardiovascular:     Rate and Rhythm: Normal rate and regular rhythm.     Heart sounds: Normal heart sounds.  Pulmonary:     Effort: Pulmonary effort is normal.     Breath sounds: Normal breath sounds.  Abdominal:     Palpations: Abdomen is soft.     Tenderness: There is no abdominal tenderness.  Musculoskeletal:     Right  lower leg: No swelling. No edema.     Left lower leg: No swelling. No edema.  Lymphadenopathy:     Cervical: No cervical adenopathy.  Skin:    General: Skin is warm.  Neurological:     Mental Status: She is alert and oriented to person, place, and time.  Psychiatric:        Attention and Perception: Attention normal.        Mood and Affect: Mood normal.        Speech: Speech normal.  Behavior: Behavior normal. Behavior is cooperative.     Last depression screening scores    04/30/2022    9:57 AM 04/17/2022   11:08 AM 01/02/2022    1:28 PM  PHQ 2/9 Scores  PHQ - 2 Score 0 0 0  PHQ- 9 Score 2  2   Last fall risk screening    04/30/2022    9:57 AM  Fall Risk   Falls in the past year? 1  Number falls in past yr: 1  Injury with Fall? 0  Risk for fall due to : History of fall(s)  Follow up Falls evaluation completed   Last Audit-C alcohol use screening    04/30/2022    9:57 AM  Alcohol Use Disorder Test (AUDIT)  1. How often do you have a drink containing alcohol? 1  2. How many drinks containing alcohol do you have on a typical day when you are drinking? 0  3. How often do you have six or more drinks on one occasion? 0  AUDIT-C Score 1   A score of 3 or more in women, and 4 or more in men indicates increased risk for alcohol abuse, EXCEPT if all of the points are from question 1   No results found for any visits on 04/30/22.  Assessment & Plan    Routine Health Maintenance and Physical Exam  Exercise Activities and Dietary recommendations --balanced diet high in fiber and protein, low in sugars, carbs, fats. --physical activity/exercise 30 minutes 3-5 times a week    Immunization History  Administered Date(s) Administered   Fluad Quad(high Dose 65+) 12/18/2021   Influenza, High Dose Seasonal PF 03/15/2016, 10/16/2017   Influenza,inj,Quad PF,6+ Mos 02/09/2015   Influenza-Unspecified 11/09/2016, 11/18/2018, 11/17/2019, 12/06/2020   Moderna Covid-19 Vaccine  Bivalent Booster 24yrs & up 04/30/2019   Moderna Sars-Covid-2 Vaccination 04/02/2019, 04/30/2019   PFIZER Comirnaty(Gray Top)Covid-19 Tri-Sucrose Vaccine 12/24/2019   Pneumococcal Conjugate-13 02/25/2017   Pneumococcal Polysaccharide-23 02/09/2015   Tdap 07/02/2017   Zoster Recombinat (Shingrix) 10/16/2017, 12/16/2017   Zoster, Live 07/30/2012    Health Maintenance  Topic Date Due   COVID-19 Vaccine (5 - 2023-24 season) 10/06/2021   MAMMOGRAM  01/12/2023 (Originally 10/04/2020)   [place] FOBT  05/03/2022   Medicare Annual Wellness (AWV)  04/17/2023   DTaP/Tdap/Td (2 - Td or Tdap) 07/03/2027   Pneumonia Vaccine 66+ Years old  Completed   INFLUENZA VACCINE  Completed   DEXA SCAN  Completed   Hepatitis C Screening  Completed   Zoster Vaccines- Shingrix  Completed   HPV VACCINES  Aged Out   COLONOSCOPY (Pts 45-66yrs Insurance coverage will need to be confirmed)  Discontinued    Discussed health benefits of physical activity, and encouraged her to engage in regular exercise appropriate for her age and condition.  Problem List Items Addressed This Visit       Other   Pure hypercholesterolemia    Unmedicated Repeat fasting lipids for stability       Relevant Orders   CBC w/Diff/Platelet   Comprehensive Metabolic Panel (CMET)   Lipid Profile   Other Visit Diagnoses     Annual physical exam    -  Primary   Colon cancer screening       Relevant Orders   IFOBT POC (occult bld, rslt in office)      Pt given FOBT today to do at home and return. Pt declines mammogram screening, would like to d/c.  Return in about 1 year (around  04/30/2023) for CPE, AVW.     I, Mikey Kirschner, [name] have reviewed all documentation for this visit. The documentation on  04/30/22 for the exam, diagnosis, procedures, and orders are all accurate and complete.  Mikey Kirschner, [name] [facility] [id number] [place], [place], [address] Office:  [phone] Fax: [place]

## 2022-04-30 ENCOUNTER — Encounter: Payer: Self-pay | Admitting: Physician Assistant

## 2022-04-30 ENCOUNTER — Ambulatory Visit (INDEPENDENT_AMBULATORY_CARE_PROVIDER_SITE_OTHER): Payer: Medicare PPO | Admitting: Physician Assistant

## 2022-04-30 VITALS — BP 137/78 | HR 73 | Ht 66.0 in | Wt 137.4 lb

## 2022-04-30 DIAGNOSIS — Z1211 Encounter for screening for malignant neoplasm of colon: Secondary | ICD-10-CM | POA: Diagnosis not present

## 2022-04-30 DIAGNOSIS — Z0000 Encounter for general adult medical examination without abnormal findings: Secondary | ICD-10-CM | POA: Diagnosis not present

## 2022-04-30 DIAGNOSIS — E7800 Pure hypercholesterolemia, unspecified: Secondary | ICD-10-CM | POA: Diagnosis not present

## 2022-04-30 NOTE — Assessment & Plan Note (Signed)
Unmedicated Repeat fasting lipids for stability

## 2022-05-01 LAB — LIPID PANEL
Chol/HDL Ratio: 2.3 ratio (ref 0.0–4.4)
Cholesterol, Total: 196 mg/dL (ref 100–199)
HDL: 87 mg/dL (ref >39)
LDL Chol Calc (NIH): 89 mg/dL (ref 0–99)
Triglycerides: 118 mg/dL (ref 0–149)
VLDL Cholesterol Cal: 20 mg/dL (ref 5–40)

## 2022-05-01 LAB — COMPREHENSIVE METABOLIC PANEL
ALT: 11 IU/L (ref 0–32)
AST: 16 IU/L (ref 0–40)
Albumin/Globulin Ratio: 2.4 — ABNORMAL HIGH (ref 1.2–2.2)
Albumin: 4.5 g/dL (ref 3.8–4.8)
Alkaline Phosphatase: 93 IU/L (ref 44–121)
BUN/Creatinine Ratio: 21 (ref 12–28)
BUN: 15 mg/dL (ref 8–27)
Bilirubin Total: 0.3 mg/dL (ref 0.0–1.2)
CO2: 23 mmol/L (ref 20–29)
Calcium: 9.7 mg/dL (ref 8.7–10.3)
Chloride: 104 mmol/L (ref 96–106)
Creatinine, Ser: 0.7 mg/dL (ref 0.57–1.00)
Globulin, Total: 1.9 g/dL (ref 1.5–4.5)
Glucose: 84 mg/dL (ref 70–99)
Potassium: 4.5 mmol/L (ref 3.5–5.2)
Sodium: 142 mmol/L (ref 134–144)
Total Protein: 6.4 g/dL (ref 6.0–8.5)
eGFR: 91 mL/min/{1.73_m2} (ref >59)

## 2022-05-01 LAB — CBC WITH DIFFERENTIAL/PLATELET
Basophils Absolute: 0.1 10*3/uL (ref 0.0–0.2)
Basos: 2 %
EOS (ABSOLUTE): 0.2 10*3/uL (ref 0.0–0.4)
Eos: 3 %
Hematocrit: 39 % (ref 34.0–46.6)
Hemoglobin: 13.2 g/dL (ref 11.1–15.9)
Immature Grans (Abs): 0 10*3/uL (ref 0.0–0.1)
Immature Granulocytes: 0 %
Lymphocytes Absolute: 1.6 10*3/uL (ref 0.7–3.1)
Lymphs: 32 %
MCH: 30.3 pg (ref 26.6–33.0)
MCHC: 33.8 g/dL (ref 31.5–35.7)
MCV: 89 fL (ref 79–97)
Monocytes Absolute: 0.5 10*3/uL (ref 0.1–0.9)
Monocytes: 9 %
Neutrophils Absolute: 2.8 10*3/uL (ref 1.4–7.0)
Neutrophils: 54 %
Platelets: 307 10*3/uL (ref 150–450)
RBC: 4.36 x10E6/uL (ref 3.77–5.28)
RDW: 13 % (ref 11.7–15.4)
WBC: 5.2 10*3/uL (ref 3.4–10.8)

## 2022-05-03 ENCOUNTER — Other Ambulatory Visit: Payer: Self-pay

## 2022-05-03 DIAGNOSIS — Z1211 Encounter for screening for malignant neoplasm of colon: Secondary | ICD-10-CM

## 2022-05-03 LAB — IFOBT (OCCULT BLOOD): IFOBT: NEGATIVE

## 2022-05-15 DIAGNOSIS — L0100 Impetigo, unspecified: Secondary | ICD-10-CM | POA: Diagnosis not present

## 2022-05-15 DIAGNOSIS — B001 Herpesviral vesicular dermatitis: Secondary | ICD-10-CM | POA: Diagnosis not present

## 2022-06-08 ENCOUNTER — Other Ambulatory Visit: Payer: Self-pay

## 2022-06-08 ENCOUNTER — Emergency Department
Admission: EM | Admit: 2022-06-08 | Discharge: 2022-06-08 | Disposition: A | Discharge status: Home / Self Care | Payer: Medicare PPO | Attending: Emergency Medicine | Admitting: Emergency Medicine

## 2022-06-08 ENCOUNTER — Emergency Department: Payer: Medicare PPO

## 2022-06-08 DIAGNOSIS — M25552 Pain in left hip: Secondary | ICD-10-CM | POA: Diagnosis not present

## 2022-06-08 DIAGNOSIS — M7632 Iliotibial band syndrome, left leg: Secondary | ICD-10-CM | POA: Diagnosis not present

## 2022-06-08 MED ORDER — LIDOCAINE 5 % EX PTCH: 1.0000 | MEDICATED_PATCH | Freq: Two times a day (BID) | CUTANEOUS | 0 refills | Status: DC

## 2022-06-08 MED ORDER — LIDOCAINE 5 % EX PTCH
1.0000 | MEDICATED_PATCH | CUTANEOUS | Status: DC
  Administered 2022-06-08: 1 via TRANSDERMAL
  Filled 2022-06-08: qty 1

## 2022-06-08 MED ORDER — KETOROLAC TROMETHAMINE 15 MG/ML IJ SOLN
15.0000 mg | Freq: Once | INTRAMUSCULAR | Status: AC
  Administered 2022-06-08: 15 mg via INTRAMUSCULAR
  Filled 2022-06-08: qty 1

## 2022-06-08 NOTE — ED Provider Notes (Signed)
[facility] Provider Note    Event Date/Time   First MD Initiated Contact with Patient 06/08/22 0815     (approximate)   History   Hip Pain   HPI  Vanessa Dominguez is a 77 y.o. female with no significant past medical history who comes the ED complaining of left hip pain radiating down to the left mid thigh laterally since yesterday.  Worse with movement.  No falls trips slips or other injuries.  Able to bear weight and walk.  She normally walks about 2 miles a day but has been unable to do this since yesterday due to the pain.  Prior to the onset of the symptoms, she does report doing gardening, planting flowers which required repetitive crouching and hip flexion.     Physical Exam   Triage Vital Signs: ED Triage Vitals  Enc Vitals Group     BP 06/08/22 0806 (!) 207/88     Pulse Rate 06/08/22 0804 81     Resp 06/08/22 0804 18     Temp 06/08/22 0804 98.2 F (36.8 C)     Temp Source 06/08/22 0804 Oral     SpO2 06/08/22 0804 100 %     Weight 06/08/22 0804 132 lb (59.9 kg)     Height 06/08/22 0804 5\' 6"  (1.676 m)     Head Circumference --      Peak Flow --      Pain Score 06/08/22 0804 5     Pain Loc --      Pain Edu? --      Excl. in GC? --     Most recent vital signs: Vitals:   06/08/22 0804 06/08/22 0806  BP:  (!) 207/88  Pulse: 81   Resp: 18   Temp: 98.2 F (36.8 C)   SpO2: 100%     General: Awake, no distress.  CV:  Good peripheral perfusion.  Resp:  Normal effort.  Abd:  No distention.  Other:  Full range of motion.  No bony point tenderness.  Pain is reproduced with stress of anterior hip flexion as well as passive hip adduction   ED Results / Procedures / Treatments   Labs (all labs ordered are listed, but only abnormal results are displayed) Labs Reviewed - No data to display   RADIOLOGY X-ray left hip interpreted by me, negative for fracture or dislocation.  Radiology report  reviewed   PROCEDURES:  Procedures   MEDICATIONS ORDERED IN ED: Medications  lidocaine (LIDODERM) 5 % 1 patch (1 patch Transdermal Patch Applied 06/08/22 0847)  ketorolac (TORADOL) 15 MG/ML injection 15 mg (15 mg Intramuscular Given 06/08/22 0848)     IMPRESSION / MDM / ASSESSMENT AND PLAN / ED COURSE  I reviewed the triage vital signs and the nursing notes.                              Differential diagnosis includes, but is not limited to, pelvis fracture, femur fracture, bursitis, muscle strain/iliotibial band syndrome, osseous neoplasm   ----------------------------------------- 9:11 AM on 06/08/2022 ----------------------------------------- Patient presents with left hip pain, atraumatic.  Clinically consistent with IT band syndrome.  Exam is not consistent with fracture, and x-ray is reassuring.  No evidence of bony lesions.  Treat with Lidoderm patch, NSAIDs, gentle activity and stretching.      FINAL CLINICAL IMPRESSION(S) / ED DIAGNOSES   Final diagnoses:  Iliotibial band syndrome of left side  Rx / DC Orders   ED Discharge Orders          Ordered    lidocaine (LIDODERM) 5 %  Every 12 hours        06/08/22 0909             Note:  This document was prepared using Dragon voice recognition software and may include unintentional dictation errors.   Sharman Cheek, MD 06/08/22 [id number]

## 2022-06-08 NOTE — ED Triage Notes (Signed)
Pt in with co left hip pain since yesterday, no injury. Pain worse on ambulation and movement.

## 2022-06-10 DIAGNOSIS — M7062 Trochanteric bursitis, left hip: Secondary | ICD-10-CM | POA: Diagnosis not present

## 2022-06-12 ENCOUNTER — Telehealth: Payer: Self-pay | Admitting: *Deleted

## 2022-06-12 NOTE — Transitions of Care (Post Inpatient/ED Visit) (Signed)
   06/12/2022  Name: Vanessa Dominguez MRN: [id number] DOB: 01/10/1946  Today's TOC FU Call Status: Today's TOC FU Call Status:: Successful TOC FU Call Competed TOC FU Call Complete Date: 06/12/22  Transition Care Management Follow-up Telephone Call Date of Discharge: 06/08/22 Discharge Facility: [facility]) Type of Discharge: Emergency Department Reason for ED Visit: Other: (Iliotibial band syndrome of left side) How have you been since you were released from the hospital?: Better Any questions or concerns?: No  Items Reviewed: Did you receive and understand the discharge instructions provided?: Yes Medications obtained,verified, and reconciled?: Yes (Medications Reviewed) Any new allergies since your discharge?: No Dietary orders reviewed?: No Do you have support at home?: Yes People in Home: spouse Name of Support/Comfort Primary Source: Fayrene Fearing  Medications Reviewed Today: Medications Reviewed Today     Reviewed by Luella Cook, RN (Case Manager) on 06/12/22 at 1348  Med List Status: <None>   Medication Order Taking? Sig Documenting Provider Last Dose Status Informant  gabapentin (NEURONTIN) 300 MG capsule [id number] Yes TAKE 1 CAPSULE 3 TIMES DAILY Bacigalupo, Marzella Schlein, MD Taking Active   lidocaine (LIDODERM) 5 % [id number] Yes Place 1 patch onto the skin every 12 (twelve) hours. Remove & Discard patch within 12 hours or as directed by MD Sharman Cheek, MD Taking Active   omeprazole (PRILOSEC) 20 MG capsule [id number] Yes Take 1 capsule (20 mg total) by mouth daily. [name], [name] Taking Active             Home Care and Equipment/Supplies: Were Home Health Services Ordered?: NA  Functional Questionnaire: Do you need assistance with bathing/showering or dressing?: No Do you need assistance with meal preparation?: No Do you need assistance with eating?: No Do you have difficulty maintaining continence: No Do you need assistance  with getting out of bed/getting out of a chair/moving?: No Do you have difficulty managing or taking your medications?: No  Follow up appointments reviewed: PCP Follow-up appointment confirmed?: No MD Provider Line [phone] Given: Yes (Per patient she will make appt if she does not improve) Specialist Hospital Follow-up appointment confirmed?: Yes Date of Specialist follow-up appointment?: 06/10/22 Follow-Up Specialty Provider:: [facility] Do you need transportation to your follow-up appointment?: No Do you understand care options if your condition(s) worsen?: Yes-patient verbalized understanding  SDOH Interventions Today    Flowsheet Row Most Recent Value  SDOH Interventions   Food Insecurity Interventions Intervention Not Indicated  Housing Interventions Intervention Not Indicated  Transportation Interventions Intervention Not Indicated       TOC Interventions Today    Flowsheet Row Most Recent Value  TOC Interventions   TOC Interventions Discussed/Reviewed TOC Interventions Reviewed, TOC Interventions Discussed      Gean Maidens BSN RN [facility] [phone]

## 2022-06-12 NOTE — Transitions of Care (Post Inpatient/ED Visit) (Signed)
   06/12/2022  Name: Vanessa Dominguez MRN: [id number] DOB: 04/16/1945  Today's TOC FU Call Status: Today's TOC FU Call Status:: Unsuccessul Call (1st Attempt) Unsuccessful Call (1st Attempt) Date: 06/12/22  Attempted to reach the patient regarding the most recent Inpatient/ED visit.  Follow Up Plan: Additional outreach attempts will be made to reach the patient to complete the Transitions of Care (Post Inpatient/ED visit) call.   Gean Maidens BSN RN [facility] [phone]

## 2022-06-17 DIAGNOSIS — M7062 Trochanteric bursitis, left hip: Secondary | ICD-10-CM | POA: Diagnosis not present

## 2022-07-16 NOTE — Progress Notes (Unsigned)
     I,Rohit Deloria,acting as a Neurosurgeon for [employer], [name].,have documented all relevant documentation on the behalf of [name], [name],as directed by  [name], [name] while in the presence of [name], [name].   Established patient visit   Patient: Vanessa Dominguez   DOB: 1945-03-25   77 y.o. Female  MRN: [id number] Visit Date: 07/17/2022  Today's healthcare provider: [name], [name]   No chief complaint on file.  Subjective    HPI  Patient is here left leg pain,   Medications: Outpatient Medications Prior to Visit  Medication Sig   gabapentin (NEURONTIN) 300 MG capsule TAKE 1 CAPSULE 3 TIMES DAILY   lidocaine (LIDODERM) 5 % Place 1 patch onto the skin every 12 (twelve) hours. Remove & Discard patch within 12 hours or as directed by MD   omeprazole (PRILOSEC) 20 MG capsule Take 1 capsule (20 mg total) by mouth daily.   No facility-administered medications prior to visit.    Review of Systems  {Labs  Heme  Chem  Endocrine  Serology  Results Review (optional):23779}   Objective    There were no vitals taken for this visit. {Show previous Annayah Worthley signs (optional):23777}  Physical Exam  ***  No results found for any visits on 07/17/22.  Assessment & Plan     ***  No follow-ups on file.      {provider attestation***:1}   [name], [name]  [facility] [phone] (phone) [phone] (fax)  [facility]

## 2022-07-17 ENCOUNTER — Encounter: Payer: Self-pay | Admitting: Physician Assistant

## 2022-07-17 ENCOUNTER — Ambulatory Visit: Payer: Medicare PPO | Admitting: Physician Assistant

## 2022-07-17 VITALS — BP 133/85 | HR 83 | Temp 97.8°F | Resp 14 | Ht 66.0 in | Wt 131.8 lb

## 2022-07-17 DIAGNOSIS — M25552 Pain in left hip: Secondary | ICD-10-CM | POA: Diagnosis not present

## 2022-07-24 ENCOUNTER — Ambulatory Visit
Admission: RE | Admit: 2022-07-24 | Discharge: 2022-07-24 | Disposition: A | Discharge status: Home / Self Care | Payer: Medicare PPO | Source: Ambulatory Visit | Attending: Physician Assistant | Admitting: Physician Assistant

## 2022-07-24 DIAGNOSIS — M25552 Pain in left hip: Secondary | ICD-10-CM | POA: Insufficient documentation

## 2022-07-24 DIAGNOSIS — M1612 Unilateral primary osteoarthritis, left hip: Secondary | ICD-10-CM | POA: Diagnosis not present

## 2022-08-30 ENCOUNTER — Other Ambulatory Visit: Payer: Self-pay | Admitting: Physician Assistant

## 2022-08-30 DIAGNOSIS — K130 Diseases of lips: Secondary | ICD-10-CM

## 2022-09-05 DIAGNOSIS — M48062 Spinal stenosis, lumbar region with neurogenic claudication: Secondary | ICD-10-CM | POA: Diagnosis not present

## 2022-09-05 DIAGNOSIS — M1612 Unilateral primary osteoarthritis, left hip: Secondary | ICD-10-CM | POA: Diagnosis not present

## 2023-01-16 DIAGNOSIS — M48061 Spinal stenosis, lumbar region without neurogenic claudication: Secondary | ICD-10-CM | POA: Diagnosis not present

## 2023-01-16 DIAGNOSIS — R200 Anesthesia of skin: Secondary | ICD-10-CM | POA: Insufficient documentation

## 2023-01-16 DIAGNOSIS — M7062 Trochanteric bursitis, left hip: Secondary | ICD-10-CM | POA: Diagnosis not present

## 2023-01-16 DIAGNOSIS — M1612 Unilateral primary osteoarthritis, left hip: Secondary | ICD-10-CM | POA: Diagnosis not present

## 2023-02-25 DIAGNOSIS — M7062 Trochanteric bursitis, left hip: Secondary | ICD-10-CM | POA: Diagnosis not present

## 2023-03-01 DIAGNOSIS — M48061 Spinal stenosis, lumbar region without neurogenic claudication: Secondary | ICD-10-CM | POA: Diagnosis not present

## 2023-03-01 DIAGNOSIS — M21372 Foot drop, left foot: Secondary | ICD-10-CM | POA: Diagnosis not present

## 2023-03-18 ENCOUNTER — Other Ambulatory Visit: Payer: Self-pay | Admitting: Neurosurgery

## 2023-03-18 DIAGNOSIS — M48061 Spinal stenosis, lumbar region without neurogenic claudication: Secondary | ICD-10-CM

## 2023-03-22 ENCOUNTER — Ambulatory Visit
Admission: RE | Admit: 2023-03-22 | Discharge: 2023-03-22 | Disposition: A | Discharge status: Home / Self Care | Payer: Medicare PPO | Source: Ambulatory Visit | Attending: Neurosurgery | Admitting: Neurosurgery

## 2023-03-22 DIAGNOSIS — M47816 Spondylosis without myelopathy or radiculopathy, lumbar region: Secondary | ICD-10-CM | POA: Diagnosis not present

## 2023-03-22 DIAGNOSIS — M48061 Spinal stenosis, lumbar region without neurogenic claudication: Secondary | ICD-10-CM | POA: Diagnosis not present

## 2023-03-22 DIAGNOSIS — M5126 Other intervertebral disc displacement, lumbar region: Secondary | ICD-10-CM | POA: Diagnosis not present

## 2023-03-22 DIAGNOSIS — M5136 Other intervertebral disc degeneration, lumbar region with discogenic back pain only: Secondary | ICD-10-CM | POA: Diagnosis not present

## 2023-03-22 DIAGNOSIS — M47817 Spondylosis without myelopathy or radiculopathy, lumbosacral region: Secondary | ICD-10-CM | POA: Diagnosis not present

## 2023-03-26 DIAGNOSIS — M21372 Foot drop, left foot: Secondary | ICD-10-CM | POA: Diagnosis not present

## 2023-03-26 DIAGNOSIS — M48062 Spinal stenosis, lumbar region with neurogenic claudication: Secondary | ICD-10-CM | POA: Diagnosis not present

## 2023-03-26 DIAGNOSIS — M4316 Spondylolisthesis, lumbar region: Secondary | ICD-10-CM | POA: Diagnosis not present

## 2023-04-15 ENCOUNTER — Other Ambulatory Visit: Payer: Self-pay | Admitting: Neurosurgery

## 2023-05-01 ENCOUNTER — Encounter: Payer: Self-pay | Admitting: Emergency Medicine

## 2023-05-01 DIAGNOSIS — M21379 Foot drop, unspecified foot: Secondary | ICD-10-CM | POA: Insufficient documentation

## 2023-05-08 ENCOUNTER — Ambulatory Visit: Payer: Self-pay

## 2023-05-08 VITALS — Ht 67.0 in | Wt 135.0 lb

## 2023-05-08 DIAGNOSIS — Z0000 Encounter for general adult medical examination without abnormal findings: Secondary | ICD-10-CM | POA: Diagnosis not present

## 2023-05-08 NOTE — Pre-Procedure Instructions (Signed)
 Surgical Instructions   Your procedure is scheduled on Wednesday, April 16th. Report to [facility] Main Entrance "A" at 07:30 A.M., then check in with the Admitting office. Any questions or running late day of surgery: call [phone]  Questions prior to your surgery date: call [phone], Monday-Friday, 8am-4pm. If you experience any cold or flu symptoms such as cough, fever, chills, shortness of breath, etc. between now and your scheduled surgery, please notify us at the above number.     Remember:  Do not eat or drink after midnight the night before your surgery    Take these medicines the morning of surgery with A SIP OF WATER  gabapentin (NEURONTIN)     One week prior to surgery, STOP taking any Aspirin (unless otherwise instructed by your surgeon) Aleve, Naproxen, Ibuprofen, Motrin, Advil, Goody's, BC's, all herbal medications, fish oil, and non-prescription vitamins.                     Do NOT Smoke (Tobacco/Vaping) for 24 hours prior to your procedure.  If you use a CPAP at night, you may bring your mask/headgear for your overnight stay.   You will be asked to remove any contacts, glasses, piercing's, hearing aid's, dentures/partials prior to surgery. Please bring cases for these items if needed.    Patients discharged the day of surgery will not be allowed to drive home, and someone needs to stay with them for 24 hours.  SURGICAL WAITING ROOM VISITATION Patients may have no more than 2 support people in the waiting area - these visitors may rotate.   Pre-op nurse will coordinate an appropriate time for 1 ADULT support person, who may not rotate, to accompany patient in pre-op.  Children under the age of 3 must have an adult with them who is not the patient and must remain in the main waiting area with an adult.  If the patient needs to stay at the hospital during part of their recovery, the visitor guidelines for inpatient rooms apply.  Please refer to the [facility] website for the visitor guidelines for any additional information.   If you received a COVID test during your pre-op visit  it is requested that you wear a mask when out in public, stay away from anyone that may not be feeling well and notify your surgeon if you develop symptoms. If you have been in contact with anyone that has tested positive in the last 10 days please notify you surgeon.      Pre-operative 5 CHG Bathing Instructions   You can play a key role in reducing the risk of infection after surgery. Your skin needs to be as free of germs as possible. You can reduce the number of germs on your skin by washing with CHG (chlorhexidine gluconate) soap before surgery. CHG is an antiseptic soap that kills germs and continues to kill germs even after washing.   DO NOT use if you have an allergy to chlorhexidine/CHG or antibacterial soaps. If your skin becomes reddened or irritated, stop using the CHG and notify one of our RNs at [phone].   Please shower with the CHG soap starting 4 days before surgery using the following schedule:     Please keep in mind the following:  DO NOT shave, including legs and underarms, starting the day of your first shower.   You may shave your face at any point before/day of surgery.  Place clean sheets on your bed the day you start  using CHG soap. Use a clean washcloth (not used since being washed) for each shower. DO NOT sleep with pets once you start using the CHG.   CHG Shower Instructions:  Wash your face and private area with normal soap. If you choose to wash your hair, wash first with your normal shampoo.  After you use shampoo/soap, rinse your hair and body thoroughly to remove shampoo/soap residue.  Turn the water OFF and apply about 3 tablespoons (45 ml) of CHG soap to a CLEAN washcloth.  Apply CHG soap ONLY FROM YOUR NECK DOWN TO YOUR TOES (washing for 3-5 minutes)  DO NOT use CHG soap on face, private areas, open wounds, or sores.   Pay special attention to the area where your surgery is being performed.  If you are having back surgery, having someone wash your back for you may be helpful. Wait 2 minutes after CHG soap is applied, then you may rinse off the CHG soap.  Pat dry with a clean towel  Put on clean clothes/pajamas   If you choose to wear lotion, please use ONLY the CHG-compatible lotions that are listed below.  Additional instructions for the day of surgery: DO NOT APPLY any lotions, deodorants, cologne, or perfumes.   Do not bring valuables to the hospital. [facility] is not responsible for any belongings/valuables. Do not wear nail polish, gel polish, artificial nails, or any other type of covering on natural nails (fingers and toes) Do not wear jewelry or makeup Put on clean/comfortable clothes.  Please brush your teeth.  Ask your nurse before applying any prescription medications to the skin.     CHG Compatible Lotions   Aveeno Moisturizing lotion  Cetaphil Moisturizing Cream  Cetaphil Moisturizing Lotion  Clairol Herbal Essence Moisturizing Lotion, Dry Skin  Clairol Herbal Essence Moisturizing Lotion, Extra Dry Skin  Clairol Herbal Essence Moisturizing Lotion, Normal Skin  Curel Age Defying Therapeutic Moisturizing Lotion with Alpha Hydroxy  Curel Extreme Care Body Lotion  Curel Soothing Hands Moisturizing Hand Lotion  Curel Therapeutic Moisturizing Cream, Fragrance-Free  Curel Therapeutic Moisturizing Lotion, Fragrance-Free  Curel Therapeutic Moisturizing Lotion, Original Formula  Eucerin Daily Replenishing Lotion  Eucerin Dry Skin Therapy Plus Alpha Hydroxy Crme  Eucerin Dry Skin Therapy Plus Alpha Hydroxy Lotion  Eucerin Original Crme  Eucerin Original Lotion  Eucerin Plus Crme Eucerin Plus Lotion  Eucerin TriLipid Replenishing Lotion  Keri Anti-Bacterial Hand Lotion  Keri Deep Conditioning Original Lotion Dry Skin Formula Softly Scented  Keri Deep Conditioning Original Lotion,  Fragrance Free Sensitive Skin Formula  Keri Lotion Fast Absorbing Fragrance Free Sensitive Skin Formula  Keri Lotion Fast Absorbing Softly Scented Dry Skin Formula  Keri Original Lotion  Keri Skin Renewal Lotion Keri Silky Smooth Lotion  Keri Silky Smooth Sensitive Skin Lotion  Nivea Body Creamy Conditioning Oil  Nivea Body Extra Enriched Lotion  Nivea Body Original Lotion  Nivea Body Sheer Moisturizing Lotion Nivea Crme  Nivea Skin Firming Lotion  NutraDerm 30 Skin Lotion  NutraDerm Skin Lotion  NutraDerm Therapeutic Skin Cream  NutraDerm Therapeutic Skin Lotion  ProShield Protective Hand Cream  Provon moisturizing lotion  Please read over the following fact sheets that you were given.

## 2023-05-08 NOTE — Patient Instructions (Addendum)
 Vanessa Dominguez , Thank you for taking time to come for your Medicare Wellness Visit. I appreciate your ongoing commitment to your health goals. Please review the following plan we discussed and let me know if I can assist you in the future.   Referrals/Orders/Follow-Ups/Clinician Recommendations: Recommend getting a routine eye exam.   This is a list of the screening recommended for you and due dates:  Health Maintenance  Topic Date Due   Mammogram  10/04/2020   COVID-19 Vaccine (5 - 2024-25 season) 10/07/2022   Stool Blood Test  05/03/2023   Flu Shot  09/06/2023   Medicare Annual Wellness Visit  05/07/2024   DTaP/Tdap/Td vaccine (2 - Td or Tdap) 07/03/2027   Pneumonia Vaccine  Completed   DEXA scan (bone density measurement)  Completed   Hepatitis C Screening  Completed   Zoster (Shingles) Vaccine  Completed   HPV Vaccine  Aged Out   Colon Cancer Screening  Discontinued    Advanced directives: (Copy Requested) Please bring a copy of your health care power of attorney and living will to the office to be added to your chart at your convenience. You can mail to [facility] [address] [place], [place] [address] or email to [email or web address]  Next Medicare Annual Wellness Visit scheduled for next year: Yes, 05/13/24 @ 9:30am (phone visit)  Fall Prevention in the Home, Adult Falls can cause injuries and affect people of all ages. There are many simple things that you can do to make your home safe and to help prevent falls. If you need it, ask for help making these changes. What actions can I take to prevent falls? General information Use good lighting in all rooms. Make sure to: Replace any light bulbs that burn out. Turn on lights if it is dark and use night-lights. Keep items that you use often in easy-to-reach places. Lower the shelves around your home if needed. Move furniture so that there are clear paths around it. Do not keep throw rugs or other things on  the floor that can make you trip. If any of your floors are uneven, fix them. Add color or contrast paint or tape to clearly mark and help you see: Grab bars or handrails. First and last steps of staircases. Where the edge of each step is. If you use a ladder or stepladder: Make sure that it is fully opened. Do not climb a closed ladder. Make sure the sides of the ladder are locked in place. Have someone hold the ladder while you use it. Know where your pets are as you move through your home. What can I do in the bathroom?     Keep the floor dry. Clean up any water that is on the floor right away. Remove soap buildup in the bathtub or shower. Buildup makes bathtubs and showers slippery. Use non-skid mats or decals on the floor of the bathtub or shower. Attach bath mats securely with double-sided, non-slip rug tape. If you need to sit down while you are in the shower, use a non-slip stool. Install grab bars by the toilet and in the bathtub and shower. Do not use towel bars as grab bars. What can I do in the bedroom? Make sure that you have a light by your bed that is easy to reach. Do not use any sheets or blankets on your bed that hang to the floor. Have a firm bench or chair with side arms that you can use for support when  you get dressed. What can I do in the kitchen? Clean up any spills right away. If you need to reach something above you, use a sturdy step stool that has a grab bar. Keep electrical cables out of the way. Do not use floor polish or wax that makes floors slippery. What can I do with my stairs? Do not leave anything on the stairs. Make sure that you have a light switch at the top and the bottom of the stairs. Have them installed if you do not have them. Make sure that there are handrails on both sides of the stairs. Fix handrails that are broken or loose. Make sure that handrails are as long as the staircases. Install non-slip stair treads on all stairs in your  home if they do not have carpet. Avoid having throw rugs at the top or bottom of stairs, or secure the rugs with carpet tape to prevent them from moving. Choose a carpet design that does not hide the edge of steps on the stairs. Make sure that carpet is firmly attached to the stairs. Fix any carpet that is loose or worn. What can I do on the outside of my home? Use bright outdoor lighting. Repair the edges of walkways and driveways and fix any cracks. Clear paths of anything that can make you trip, such as tools or rocks. Add color or contrast paint or tape to clearly mark and help you see high doorway thresholds. Trim any bushes or trees on the main path into your home. Check that handrails are securely fastened and in good repair. Both sides of all steps should have handrails. Install guardrails along the edges of any raised decks or porches. Have leaves, snow, and ice cleared regularly. Use sand, salt, or ice melt on walkways during winter months if you live where there is ice and snow. In the garage, clean up any spills right away, including grease or oil spills. What other actions can I take? Review your medicines with your health care provider. Some medicines can make you confused or feel dizzy. This can increase your chance of falling. Wear closed-toe shoes that fit well and support your feet. Wear shoes that have rubber soles and low heels. Use a cane, walker, scooter, or crutches that help you move around if needed. Talk with your provider about other ways that you can decrease your risk of falls. This may include seeing a physical therapist to learn to do exercises to improve movement and strength. Where to find more information Centers for Disease Control and Prevention, STEADI: TonerPromos.no [employer] on Aging: BaseRingTones.pl National Institute on Aging: BaseRingTones.pl Contact a health care provider if: You are afraid of falling at home. You feel weak, drowsy, or dizzy at home. You  fall at home. Get help right away if you: Lose consciousness or have trouble moving after a fall. Have a fall that causes a head injury. These symptoms may be an emergency. Get help right away. Call 911. Do not wait to see if the symptoms will go away. Do not drive yourself to the hospital. This information is not intended to replace advice given to you by your health care provider. Make sure you discuss any questions you have with your health care provider. Document Revised: 09/25/2021 Document Reviewed: 09/25/2021 Elsevier Patient Education  2024 [employer].

## 2023-05-08 NOTE — Progress Notes (Signed)
 Subjective:   Vanessa Dominguez is a 78 y.o. who presents for a Medicare Wellness preventive visit.  Visit Complete: Virtual I connected with  Greig Right on 05/08/23 by a audio enabled telemedicine application and verified that I am speaking with the correct person using two identifiers.  Patient Location: Home  Provider Location: Home Office  I discussed the limitations of evaluation and management by telemedicine. The patient expressed understanding and agreed to proceed.  Vital Signs: Because this visit was a virtual/telehealth visit, some criteria may be missing or patient reported. Any vitals not documented were not able to be obtained and vitals that have been documented are patient reported.  VideoDeclined- This patient declined Librarian, academic. Therefore the visit was completed with audio only.  Persons Participating in Visit: Patient.  AWV Questionnaire: Yes: Patient Medicare AWV questionnaire was completed by the patient on 05/04/23; I have confirmed that all information answered by patient is correct and no changes since this date.  Cardiac Risk Factors include: advanced age (>61men, >64 women);dyslipidemia;smoking/ tobacco exposure     Objective:    Today's Vitals   05/08/23 0927 05/08/23 0928  Weight: 135 lb (61.2 kg)   Height: 5\' 7"  (1.702 m)   PainSc:  8    Body mass index is 21.14 kg/m.     05/08/2023    9:36 AM 04/17/2022   11:11 AM 04/12/2021   10:50 AM 03/15/2016    9:07 AM 12/26/2015   12:06 PM 12/13/2015   11:04 AM  Advanced Directives  Does Patient Have a Medical Advance Directive? Yes Yes No Yes No Yes  Type of Estate agent of [place];Living will     Healthcare Power of [place];Living will  Does patient want to make changes to medical advance directive? No - Patient declined     No - Patient declined  Copy of Healthcare Power of Attorney in Chart? No - copy requested     No - copy requested   Would patient like information on creating a medical advance directive?   No - Patient declined       Current Medications (verified) Outpatient Encounter Medications as of 05/08/2023  Medication Sig   CALCIUM PO Take 1 tablet by mouth daily.   gabapentin (NEURONTIN) 300 MG capsule TAKE 1 CAPSULE 3 TIMES DAILY   MAGNESIUM PO Take 1 tablet by mouth daily.   vitamin E 180 MG (400 UNITS) capsule Take 400 Units by mouth daily.   lidocaine (LIDODERM) 5 % Place 1 patch onto the skin every 12 (twelve) hours. Remove & Discard patch within 12 hours or as directed by MD (Patient not taking: Reported on 07/17/2022)   omeprazole (PRILOSEC) 20 MG capsule Take 1 capsule (20 mg total) by mouth daily. (Patient not taking: Reported on 05/06/2023)   No facility-administered encounter medications on file as of 05/08/2023.    Allergies (verified) Patient has no known allergies.   History: Past Medical History:  Diagnosis Date   Arthritis    GERD (gastroesophageal reflux disease)    Pneumonia    Past Surgical History:  Procedure Laterality Date   CATARACT EXTRACTION W/PHACO Left 12/13/2015   Procedure: CATARACT EXTRACTION PHACO AND INTRAOCULAR LENS PLACEMENT (IOC);  Surgeon: Galen Manila, MD;  Location: [facility] ORS;  Service: Ophthalmology;  Laterality: Left;  Lot# [id number] H US:00:36.0 AP%: 23.7 CDE: 8.52   CATARACT EXTRACTION W/PHACO Right 01/10/2016   Procedure: CATARACT EXTRACTION PHACO AND INTRAOCULAR LENS PLACEMENT (IOC);  Surgeon: Galen Manila,  MD;  Location: [facility] ORS;  Service: Ophthalmology;  Laterality: Right;  Lot # [id number] H [place]: 00:38.4 AP%: 21.3 CDE: 8.17   CESAREAN SECTION     EYE SURGERY     JOINT REPLACEMENT Left 02/06/2004   Lt knee   KNEE SURGERY Left [phone]   TUBAL LIGATION  02/06/1987   Family History  Problem Relation Age of Onset   Diabetes Mother    Cancer Father    Heart attack Brother        10/2016   Breast cancer Neg Hx    Social History    Socioeconomic History   Marital status: Married    Spouse name: Fayrene Fearing   Number of children: 2   Years of education: Not on file   Highest education level: 12th grade  Occupational History   Occupation: retired  Tobacco Use   Smoking status: Former    Current packs/day: 0.00    Average packs/day: 0.1 packs/day for 54.6 years (5.5 ttl pk-yrs)    Types: Cigarettes    Start date: 70    Quit date: 09/2022    Years since quitting: 0.6   Smokeless tobacco: Never   Tobacco comments:    05/08/23 2-3 cig/day when smoking  Vaping Use   Vaping status: Never Used  Substance and Sexual Activity   Alcohol use: No   Drug use: No   Sexual activity: Yes    Birth control/protection: None  Other Topics Concern   Not on file  Social History Narrative   Not on file   Social Drivers of Health   Financial Resource Strain: Low Risk  (05/08/2023)   Overall Financial Resource Strain (CARDIA)    Difficulty of Paying Living Expenses: Not hard at all  Food Insecurity: No Food Insecurity (05/08/2023)   Hunger Vital Sign    Worried About Running Out of Food in the Last Year: Never true    Ran Out of Food in the Last Year: Never true  Transportation Needs: No Transportation Needs (05/08/2023)   PRAPARE - Administrator, Civil Service (Medical): No    Lack of Transportation (Non-Medical): No  Physical Activity: Sufficiently Active (05/08/2023)   Exercise Vital Sign    Days of Exercise per Week: 7 days    Minutes of Exercise per Session: 30 min  Stress: No Stress Concern Present (05/08/2023)   [employer] of Occupational Health - Occupational Stress Questionnaire    Feeling of Stress : Only a little  Social Connections: Unknown (05/08/2023)   Social Connection and Isolation Panel [NHANES]    Frequency of Communication with Friends and Family: More than three times a week    Frequency of Social Gatherings with Friends and Family: More than three times a week    Attends Religious Services:  Patient declined    Database administrator or Organizations: No    Attends Banker Meetings: Never    Marital Status: Married    Tobacco Counseling Counseling given: Not Answered Tobacco comments: 05/08/23 2-3 cig/day when smoking    Clinical Intake:  Pre-visit preparation completed: Yes  Pain : 0-10 Pain Score: 8  Pain Type: Chronic pain Pain Location: Back Pain Descriptors / Indicators: Aching     BMI - recorded: 21.14 Nutritional Status: BMI of 19-24  Normal Nutritional Risks: None Diabetes: No  Lab Results  Component Value Date   HGBA1C 5.6 04/28/2021   HGBA1C 5.6 09/07/2019     How often do you need to have someone  help you when you read instructions, pamphlets, or other written materials from your doctor or pharmacy?: 1 - Never  Interpreter Needed?: No  Information entered by :: Tora Kindred, CMA   Activities of Daily Living     05/08/2023    9:29 AM 05/04/2023    4:26 PM  In your present state of health, do you have any difficulty performing the following activities:  Hearing? 0 0  Vision? 0 0  Difficulty concentrating or making decisions? 0 0  Walking or climbing stairs? 0 0  Dressing or bathing? 0 0  Doing errands, shopping? 0 0  Preparing Food and eating ? N N  Using the Toilet? N N  In the past six months, have you accidently leaked urine? Malvin Johns  Comment wears pad   Do you have problems with loss of bowel control? N N  Managing your Medications? N N  Managing your Finances? N N  Housekeeping or managing your Housekeeping? N N    Patient Care Team: Sherlyn Hay, DO as PCP - General (Family Medicine) Tressie Stalker, MD as Consulting Physician (Neurosurgery)  Indicate any recent Medical Services you may have received from other than [facility] providers in the past year (date may be approximate).     Assessment:   This is a routine wellness examination for [place].  Hearing/Vision screen Hearing Screening - Comments:: Denies  hearing loss Vision Screening - Comments:: Needs eye exam, last ~5 years, [facility]   Goals Addressed             This Visit's Progress    Patient Stated       Have back surgery and not be in pain       Depression Screen     05/08/2023    9:34 AM 07/17/2022    8:32 AM 04/30/2022    9:57 AM 04/17/2022   11:08 AM 01/02/2022    1:28 PM 04/12/2021   10:48 AM 04/20/2020   11:26 AM  PHQ 2/9 Scores  PHQ - 2 Score 0 0 0 0 0 0 0  PHQ- 9 Score 3 2 2  2  3     Fall Risk     05/08/2023    9:38 AM 05/04/2023    4:26 PM 07/17/2022    8:32 AM 04/30/2022    9:57 AM 04/17/2022   11:05 AM  Fall Risk   Falls in the past year? 1 1 1 1  0  Comment     2 falls;no injuries;pt sts just being clumsy  Number falls in past yr: 1 0 0 1 1  Injury with Fall? 0 0 0 0 0  Risk for fall due to : History of fall(s);Impaired balance/gait;Orthopedic patient;Impaired mobility  History of fall(s) History of fall(s) Orthopedic patient  Risk for fall due to: Comment     spinal stenosis  Follow up Falls prevention discussed;Falls evaluation completed;Education provided  Falls evaluation completed Falls evaluation completed Education provided;Falls prevention discussed    MEDICARE RISK AT HOME:  Medicare Risk at Home Any stairs in or around the home?: Yes If so, are there any without handrails?: No Home free of loose throw rugs in walkways, pet beds, electrical cords, etc?: Yes Adequate lighting in your home to reduce risk of falls?: Yes Life alert?: No Use of a cane, walker or w/c?: No Grab bars in the bathroom?: Yes Shower chair or bench in shower?: Yes Elevated toilet seat or a handicapped toilet?: No  TIMED UP AND  GO:  Was the test performed?  No  Cognitive Function: 6CIT completed        05/08/2023    9:39 AM 04/17/2022   11:17 AM 09/07/2019    9:14 AM 09/04/2018    9:12 AM  6CIT Screen  What Year? 0 points 0 points 0 points 0 points  What month? 0 points 0 points 0 points 0 points   What time? 0 points 0 points 0 points 0 points  Count back from 20 0 points 0 points 0 points 0 points  Months in reverse 0 points 0 points 0 points 0 points  Repeat phrase 2 points 0 points 2 points 0 points  Total Score 2 points 0 points 2 points 0 points    Immunizations Immunization History  Administered Date(s) Administered   Fluad Quad(high Dose 65+) 12/18/2021   Influenza, High Dose Seasonal PF 03/15/2016, 10/16/2017   Influenza,inj,Quad PF,6+ Mos 02/09/2015   Influenza-Unspecified 11/09/2016, 11/18/2018, 11/17/2019, 12/06/2020   Moderna Covid-19 Vaccine Bivalent Booster 102yrs & up 04/30/2019   Moderna Sars-Covid-2 Vaccination 04/02/2019, 04/30/2019   PFIZER Comirnaty(Gray Top)Covid-19 Tri-Sucrose Vaccine 12/24/2019   Pneumococcal Conjugate-13 02/25/2017   Pneumococcal Polysaccharide-23 02/09/2015   Tdap 07/02/2017   Zoster Recombinant(Shingrix) 10/16/2017, 12/16/2017   Zoster, Live 07/30/2012    Screening Tests Health Maintenance  Topic Date Due   MAMMOGRAM  10/04/2020   COVID-19 Vaccine (5 - 2024-25 season) 10/07/2022   COLON CANCER SCREENING ANNUAL FOBT  05/03/2023   INFLUENZA VACCINE  09/06/2023   Medicare Annual Wellness (AWV)  05/07/2024   DTaP/Tdap/Td (2 - Td or Tdap) 07/03/2027   Pneumonia Vaccine 8+ Years old  Completed   DEXA SCAN  Completed   Hepatitis C Screening  Completed   Zoster Vaccines- Shingrix  Completed   HPV VACCINES  Aged Out   Colonoscopy  Discontinued    Health Maintenance  Health Maintenance Due  Topic Date Due   MAMMOGRAM  10/04/2020   COVID-19 Vaccine (5 - 2024-25 season) 10/07/2022   COLON CANCER SCREENING ANNUAL FOBT  05/03/2023   Health Maintenance Items Addressed: See Nurse Notes  Additional Screening:  Vision Screening: Recommended annual ophthalmology exams for early detection of glaucoma and other disorders of the eye.  Dental Screening: Recommended annual dental exams for proper oral hygiene  Community Resource  Referral / Chronic Care Management: CRR required this visit?  No   CCM required this visit?  No     Plan:     I have personally reviewed and noted the following in the patient's chart:   Medical and social history Use of alcohol, tobacco or illicit drugs  Current medications and supplements including opioid prescriptions. Patient is not currently taking opioid prescriptions. Functional ability and status Nutritional status Physical activity Advanced directives List of other physicians Hospitalizations, surgeries, and ER visits in previous 12 months Vitals Screenings to include cognitive, depression, and falls Referrals and appointments  In addition, I have reviewed and discussed with patient certain preventive protocols, quality metrics, and best practice recommendations. A written personalized care plan for preventive services as well as general preventive health recommendations were provided to patient.     Tora Kindred, CMA   05/08/2023   After Visit Summary: (MyChart) Due to this being a telephonic visit, the after visit summary with patients personalized plan was offered to patient via MyChart   Notes:  6 CIT Score - 2 Needs FOBT  Recommended getting a routine eye exam (last ~5 yrs ago)Goes to [employer] [facility] [employer]  MMG and DEXA scan Declined future Covid and flu vaccines Patient having back surgery 05/22/23

## 2023-05-09 ENCOUNTER — Other Ambulatory Visit: Payer: Self-pay

## 2023-05-09 ENCOUNTER — Encounter (HOSPITAL_COMMUNITY): Payer: Self-pay

## 2023-05-09 ENCOUNTER — Encounter (HOSPITAL_COMMUNITY)
Admission: RE | Admit: 2023-05-09 | Discharge: 2023-05-09 | Disposition: A | Discharge status: Home / Self Care | Source: Ambulatory Visit | Attending: Neurosurgery | Admitting: Neurosurgery

## 2023-05-09 VITALS — BP 132/78 | HR 82 | Temp 98.1°F | Resp 17 | Ht 67.0 in | Wt 138.7 lb

## 2023-05-09 DIAGNOSIS — Z01818 Encounter for other preprocedural examination: Secondary | ICD-10-CM | POA: Insufficient documentation

## 2023-05-09 DIAGNOSIS — M4316 Spondylolisthesis, lumbar region: Secondary | ICD-10-CM | POA: Insufficient documentation

## 2023-05-09 DIAGNOSIS — Z96652 Presence of left artificial knee joint: Secondary | ICD-10-CM | POA: Insufficient documentation

## 2023-05-09 DIAGNOSIS — I447 Left bundle-branch block, unspecified: Secondary | ICD-10-CM | POA: Diagnosis not present

## 2023-05-09 DIAGNOSIS — Z01810 Encounter for preprocedural cardiovascular examination: Secondary | ICD-10-CM | POA: Diagnosis present

## 2023-05-09 DIAGNOSIS — K219 Gastro-esophageal reflux disease without esophagitis: Secondary | ICD-10-CM | POA: Insufficient documentation

## 2023-05-09 DIAGNOSIS — Z87891 Personal history of nicotine dependence: Secondary | ICD-10-CM | POA: Diagnosis not present

## 2023-05-09 DIAGNOSIS — Z01812 Encounter for preprocedural laboratory examination: Secondary | ICD-10-CM | POA: Diagnosis present

## 2023-05-09 HISTORY — DX: Left bundle-branch block, unspecified: I44.7

## 2023-05-09 LAB — CBC
HCT: 41.1 % (ref 36.0–46.0)
Hemoglobin: 13.3 g/dL (ref 12.0–15.0)
MCH: 30.4 pg (ref 26.0–34.0)
MCHC: 32.4 g/dL (ref 30.0–36.0)
MCV: 94.1 fL (ref 80.0–100.0)
Platelets: 287 10*3/uL (ref 150–400)
RBC: 4.37 MIL/uL (ref 3.87–5.11)
RDW: 12.7 % (ref 11.5–15.5)
WBC: 5 10*3/uL (ref 4.0–10.5)
nRBC: 0 % (ref 0.0–0.2)

## 2023-05-09 LAB — TYPE AND SCREEN
ABO/RH(D): O POS
Antibody Screen: NEGATIVE

## 2023-05-09 LAB — BASIC METABOLIC PANEL WITH GFR
Anion gap: 11 (ref 5–15)
BUN: 17 mg/dL (ref 8–23)
CO2: 27 mmol/L (ref 22–32)
Calcium: 9.7 mg/dL (ref 8.9–10.3)
Chloride: 102 mmol/L (ref 98–111)
Creatinine, Ser: 0.71 mg/dL (ref 0.44–1.00)
GFR, Estimated: >60 mL/min (ref >60)
Glucose, Bld: 78 mg/dL (ref 70–99)
Potassium: 4.1 mmol/L (ref 3.5–5.1)
Sodium: 140 mmol/L (ref 135–145)

## 2023-05-09 LAB — SURGICAL PCR SCREEN
MRSA, PCR: NEGATIVE
Staphylococcus aureus: POSITIVE — AB

## 2023-05-09 NOTE — Progress Notes (Signed)
 PCP - Dr. Jacquenette Shone Cardiologist - denies  PPM/ICD - denies   Chest x-ray - denies EKG - 05/09/23 Stress Test - denies ECHO - denies Cardiac Cath - denies  Sleep Study - denies   DM- denies  Last dose of GLP1 agonist-  n/a   ASA/Blood Thinner Instructions: n/a   ERAS Protcol - no, NPO   COVID TEST- n/a   Anesthesia review: yes, hx of LBBB on EKG  Patient denies shortness of breath, fever, cough and chest pain at PAT appointment   All instructions explained to the patient, with a verbal understanding of the material. Patient agrees to go over the instructions while at home for a better understanding.  The opportunity to ask questions was provided.

## 2023-05-10 NOTE — Anesthesia Preprocedure Evaluation (Addendum)
 Anesthesia Evaluation  Patient identified by MRN, date of birth, ID band Patient awake    Reviewed: Allergy & Precautions, NPO status , Patient's Chart, lab work & pertinent test results  History of Anesthesia Complications Negative for: history of anesthetic complications  Airway Mallampati: II  TM Distance: >3 FB Neck ROM: Full    Dental  (+) Dental Advisory Given   Pulmonary former smoker   breath sounds clear to auscultation       Cardiovascular (-) angina (-) dysrhythmias  Rhythm:Regular Rate:Normal  H/o longstanding LBBB   Neuro/Psych Back pain    GI/Hepatic Neg liver ROS,GERD  Medicated and Controlled,,  Endo/Other  negative endocrine ROS    Renal/GU negative Renal ROS     Musculoskeletal   Abdominal   Peds  Hematology negative hematology ROS (+)   Anesthesia Other Findings   Reproductive/Obstetrics                             Anesthesia Physical Anesthesia Plan  ASA: 2  Anesthesia Plan: General   Post-op Pain Management: Tylenol PO (pre-op)*   Induction: Intravenous  PONV Risk Score and Plan: 3 and Ondansetron, Dexamethasone and Propofol infusion  Airway Management Planned: Oral ETT  Additional Equipment: None  Intra-op Plan:   Post-operative Plan: Extubation in OR  Informed Consent: I have reviewed the patients History and Physical, chart, labs and discussed the procedure including the risks, benefits and alternatives for the proposed anesthesia with the patient or authorized representative who has indicated his/her understanding and acceptance.     Dental advisory given  Plan Discussed with: CRNA and Surgeon  Anesthesia Plan Comments: (PAT note written 05/10/2023 by Allison Zelenak, [name]. She has a chronic LBBB since at least 2010.  )       Anesthesia Quick Evaluation

## 2023-05-10 NOTE — Progress Notes (Signed)
 Anesthesia Chart Review:  Case: [id number] Date/Time: 05/22/23 0915   Procedure: POSTERIOR LUMBAR FUSION 3 LEVEL - PLIF,IP,POSTERIOR INSTRUMENTATION L23, L34, L45   Anesthesia type: General   Diagnosis: Spondylolisthesis, lumbar region [M43.16]   Pre-op diagnosis: SPONDYLOLISTHESIS, LUMBAR REGION   [facility] / [facility]   Surgeons: Tressie Stalker, MD       DISCUSSION: Patient is a 78 year old female scheduled for the above procedure.  History includes former smoker (quit 09/06/22), GERD, left BBB, osteoarthritis (left TKA 2006).   She has had a chronic left BBB  with LAD since at least 10/02/08.  She denied chest pain or SOB. CBC and BMET normal.  Anesthesia team to evaluate on the day of surgery.     VS: BP 132/78   Pulse 82   Temp 36.7 C   Resp 17   Ht 5\' 7"  (1.702 m)   Wt 62.9 kg   SpO2 99%   BMI 21.72 kg/m    PROVIDERS: Pardue, Monico Blitz, DO is PCP [facility]). Established at that practice since 02/09/15.   LABS: Labs reviewed: Acceptable for surgery. (all labs ordered are listed, but only abnormal results are displayed)  Labs Reviewed  SURGICAL PCR SCREEN - Abnormal; Notable for the following components:      Result Value   Staphylococcus aureus POSITIVE (*)    All other components within normal limits  BASIC METABOLIC PANEL WITH GFR  CBC  TYPE AND SCREEN    IMAGES: MRI L-spine 03/22/23: IMPRESSION: 1. L4-5: Marked worsening of disc degeneration with new discogenic endplate edema. Endplate osteophytes and circumferential protrusion of the disc. Facet and ligamentous hypertrophy. Very severe stenosis of the canal, lateral recesses and neural foramina at this level, considerably worsened since 2022. 2. L3-4: Chronic facet arthropathy with 3 mm of degenerative anterolisthesis. Shallow protrusion of the disc. Moderate-Severe multifactorial spinal stenosis, similar to the prior study. 3. L2-3: Endplate osteophytes and bulging  of the disc. Facet and ligamentous hypertrophy. Moderate multifactorial stenosis, similar or minimally worsened compared to the study of 2022. 4. L5-S1: Chronic facet osteoarthritis with 3 mm of degenerative anterolisthesis. Disc degeneration with loss of disc height. Small central disc protrusion. No apparent compressive stenosis.  MRI Left Hip 07/24/22: IMPRESSION: 1. Moderate appearing left hip osteoarthritis with degenerative tearing of the left anterior and superior labrum and an associated small paralabral cyst. 2. Mild right hamstring tendinosis. 3. Marked degenerative endplate signal change at L4-5 is eccentric to the left.    EKG: 05/09/23: Normal sinus rhythm Possible Left atrial enlargement Left axis deviation Left bundle branch block Abnormal ECG When compared with ECG of 26-Dec-2015 12:17, No significant change since last tracing Confirmed by Croitoru, Mihai [phone]) on 05/09/2023 8:38:12 PM - She has had a chronic left BBB  with LAD since at least 10/02/08.  Copies of comparison EKGs from [facility] on shadow chart.   CV: N/A  Past Medical History:  Diagnosis Date   Arthritis    GERD (gastroesophageal reflux disease)    Left bundle branch block    Chronic   Pneumonia     Past Surgical History:  Procedure Laterality Date   CATARACT EXTRACTION W/PHACO Left 12/13/2015   Procedure: CATARACT EXTRACTION PHACO AND INTRAOCULAR LENS PLACEMENT (IOC);  Surgeon: Galen Manila, MD;  Location: [facility] ORS;  Service: Ophthalmology;  Laterality: Left;  Lot# [id number] H US:00:36.0 AP%: 23.7 CDE: 8.52   CATARACT EXTRACTION W/PHACO Right 01/10/2016   Procedure: CATARACT EXTRACTION PHACO AND INTRAOCULAR LENS  PLACEMENT (IOC);  Surgeon: Galen Manila, MD;  Location: [facility] ORS;  Service: Ophthalmology;  Laterality: Right;  Lot # [id number] H [place]: 00:38.4 AP%: 21.3 CDE: 8.17   CESAREAN SECTION     EYE SURGERY     JOINT REPLACEMENT Left 02/06/2004   Lt knee   KNEE SURGERY Left  [phone]   TUBAL LIGATION  02/06/1987    MEDICATIONS:  CALCIUM PO   gabapentin (NEURONTIN) 300 MG capsule   lidocaine (LIDODERM) 5 %   MAGNESIUM PO   omeprazole (PRILOSEC) 20 MG capsule   vitamin E 180 MG (400 UNITS) capsule   No current facility-administered medications for this encounter.    Shonna Chock, [name] Surgical Short Stay/Anesthesiology [facility] Phone [phone] [facility] Phone [phone] 05/10/2023 1:27 PM

## 2023-06-07 NOTE — Progress Notes (Signed)
 Had PAT appt on 05/09/23  Anesthesia review: Reviewed on 05/09/23  Patient verbally denies any shortness of breath, fever, cough and chest pain during phone call   -------------  SDW INSTRUCTIONS given:  Your procedure is scheduled on Monday May 5th.  Report to [facility] Main Entrance "A" at 0530 A.M., and check in at the Admitting office.  Call this number if you have problems the morning of surgery:  [phone]   Remember:  Do not eat or after midnight the night before your surgery    Take these medicines the morning of surgery with A SIP OF WATER  gabapentin  (NEURONTIN )   As of today, STOP taking any Aspirin (unless otherwise instructed by your surgeon) Aleve, Naproxen, Ibuprofen, Motrin, Advil, Goody's, BC's, all herbal medications, fish oil, and all vitamins.                      Do not wear jewelry, make up, or nail polish            Do not wear lotions, powders, perfumes/colognes, or deodorant.            Do not shave 48 hours prior to surgery.  Men may shave face and neck.            Do not bring valuables to the hospital.            [facility] is not responsible for any belongings or valuables.  Do NOT Smoke (Tobacco/Vaping) 24 hours prior to your procedure If you use a CPAP at night, you may bring all equipment for your overnight stay.   Contacts, glasses, dentures or bridgework may not be worn into surgery.      For patients admitted to the hospital, discharge time will be determined by your treatment team.   Patients discharged the day of surgery will not be allowed to drive home, and someone needs to stay with them for 24 hours.    Special instructions:   [place]- Preparing For Surgery  Before surgery, you can play an important role. Because skin is not sterile, your skin needs to be as free of germs as possible. You can reduce the number of germs on your skin by washing with CHG (chlorahexidine gluconate) Soap before surgery.  CHG is an antiseptic  cleaner which kills germs and bonds with the skin to continue killing germs even after washing.    Oral Hygiene is also important to reduce your risk of infection.  Remember - BRUSH YOUR TEETH THE MORNING OF SURGERY WITH YOUR REGULAR TOOTHPASTE  Please do not use if you have an allergy to CHG or antibacterial soaps. If your skin becomes reddened/irritated stop using the CHG.  Do not shave (including legs and underarms) for at least 48 hours prior to first CHG shower. It is OK to shave your face.  Please follow these instructions carefully.   Shower the NIGHT BEFORE SURGERY and the MORNING OF SURGERY with DIAL Soap.   Pat yourself dry with a CLEAN TOWEL.  Wear CLEAN PAJAMAS to bed the night before surgery  Place CLEAN SHEETS on your bed the night of your first shower and DO NOT SLEEP WITH PETS.   Day of Surgery: Please shower morning of surgery  Wear Clean/Comfortable clothing the morning of surgery Do not apply any deodorants/lotions.   Remember to brush your teeth WITH YOUR REGULAR TOOTHPASTE.   Questions were answered. Patient verbalized understanding of instructions.

## 2023-06-10 ENCOUNTER — Ambulatory Visit (HOSPITAL_COMMUNITY)

## 2023-06-10 ENCOUNTER — Encounter (HOSPITAL_COMMUNITY): Payer: Self-pay | Admitting: Neurosurgery

## 2023-06-10 ENCOUNTER — Encounter (HOSPITAL_COMMUNITY)
Admission: RE | Disposition: A | Discharge status: Home / Self Care | Payer: Self-pay | Source: Ambulatory Visit | Attending: Neurosurgery

## 2023-06-10 ENCOUNTER — Ambulatory Visit (HOSPITAL_BASED_OUTPATIENT_CLINIC_OR_DEPARTMENT_OTHER): Payer: Self-pay | Admitting: Anesthesiology

## 2023-06-10 ENCOUNTER — Ambulatory Visit (HOSPITAL_COMMUNITY)
Admission: RE | Admit: 2023-06-10 | Discharge: 2023-06-11 | Disposition: A | Discharge status: Home / Self Care | Source: Ambulatory Visit | Attending: Neurosurgery | Admitting: Neurosurgery

## 2023-06-10 ENCOUNTER — Ambulatory Visit (HOSPITAL_COMMUNITY): Payer: Self-pay | Admitting: Physician Assistant

## 2023-06-10 DIAGNOSIS — G834 Cauda equina syndrome: Secondary | ICD-10-CM | POA: Insufficient documentation

## 2023-06-10 DIAGNOSIS — M4316 Spondylolisthesis, lumbar region: Secondary | ICD-10-CM

## 2023-06-10 DIAGNOSIS — M5116 Intervertebral disc disorders with radiculopathy, lumbar region: Secondary | ICD-10-CM | POA: Diagnosis not present

## 2023-06-10 DIAGNOSIS — I447 Left bundle-branch block, unspecified: Secondary | ICD-10-CM | POA: Insufficient documentation

## 2023-06-10 DIAGNOSIS — M48062 Spinal stenosis, lumbar region with neurogenic claudication: Secondary | ICD-10-CM | POA: Diagnosis not present

## 2023-06-10 DIAGNOSIS — K219 Gastro-esophageal reflux disease without esophagitis: Secondary | ICD-10-CM | POA: Insufficient documentation

## 2023-06-10 DIAGNOSIS — Z87891 Personal history of nicotine dependence: Secondary | ICD-10-CM | POA: Insufficient documentation

## 2023-06-10 LAB — BASIC METABOLIC PANEL WITH GFR
Anion gap: 11 (ref 5–15)
BUN: 15 mg/dL (ref 8–23)
CO2: 26 mmol/L (ref 22–32)
Calcium: 9.9 mg/dL (ref 8.9–10.3)
Chloride: 102 mmol/L (ref 98–111)
Creatinine, Ser: 0.71 mg/dL (ref 0.44–1.00)
GFR, Estimated: >60 mL/min (ref >60)
Glucose, Bld: 104 mg/dL — ABNORMAL HIGH (ref 70–99)
Potassium: 5 mmol/L (ref 3.5–5.1)
Sodium: 139 mmol/L (ref 135–145)

## 2023-06-10 LAB — CBC
HCT: 41.1 % (ref 36.0–46.0)
Hemoglobin: 13.5 g/dL (ref 12.0–15.0)
MCH: 30.5 pg (ref 26.0–34.0)
MCHC: 32.8 g/dL (ref 30.0–36.0)
MCV: 92.8 fL (ref 80.0–100.0)
Platelets: 319 10*3/uL (ref 150–400)
RBC: 4.43 MIL/uL (ref 3.87–5.11)
RDW: 12.7 % (ref 11.5–15.5)
WBC: 5 10*3/uL (ref 4.0–10.5)
nRBC: 0 % (ref 0.0–0.2)

## 2023-06-10 LAB — POCT I-STAT, CHEM 8
BUN: 15 mg/dL (ref 8–23)
Calcium, Ion: 1.22 mmol/L (ref 1.15–1.40)
Chloride: 103 mmol/L (ref 98–111)
Creatinine, Ser: 0.6 mg/dL (ref 0.44–1.00)
Glucose, Bld: 107 mg/dL — ABNORMAL HIGH (ref 70–99)
HCT: 36 % (ref 36.0–46.0)
Hemoglobin: 12.2 g/dL (ref 12.0–15.0)
Potassium: 4.1 mmol/L (ref 3.5–5.1)
Sodium: 138 mmol/L (ref 135–145)
TCO2: 25 mmol/L (ref 22–32)

## 2023-06-10 LAB — TYPE AND SCREEN
ABO/RH(D): O POS
Antibody Screen: NEGATIVE

## 2023-06-10 SURGERY — POSTERIOR LUMBAR FUSION 3 LEVEL
Anesthesia: General

## 2023-06-10 MED ORDER — ONDANSETRON HCL 4 MG/2ML IJ SOLN: 4.0000 mg | Freq: Four times a day (QID) | INTRAMUSCULAR | Status: DC | PRN

## 2023-06-10 MED ORDER — PHENYLEPHRINE HCL-NACL 20-0.9 MG/250ML-% IV SOLN
INTRAVENOUS | Status: DC | PRN
  Administered 2023-06-10: 50 ug/min via INTRAVENOUS

## 2023-06-10 MED ORDER — VASHE WOUND IRRIGATION OPTIME
TOPICAL | Status: DC | PRN
  Administered 2023-06-10: 34 [oz_av] via TOPICAL

## 2023-06-10 MED ORDER — BUPIVACAINE-EPINEPHRINE (PF) 0.5% -1:200000 IJ SOLN
INTRAMUSCULAR | Status: DC | PRN
  Administered 2023-06-10: 10 mL

## 2023-06-10 MED ORDER — SODIUM CHLORIDE 0.9 % IV SOLN: INTRAVENOUS | Status: DC | PRN

## 2023-06-10 MED ORDER — ONDANSETRON HCL 4 MG PO TABS
4.0000 mg | ORAL_TABLET | Freq: Four times a day (QID) | ORAL | Status: DC | PRN
  Administered 2023-06-11: 4 mg via ORAL
  Filled 2023-06-10: qty 1

## 2023-06-10 MED ORDER — GABAPENTIN 300 MG PO CAPS
300.0000 mg | ORAL_CAPSULE | Freq: Three times a day (TID) | ORAL | Status: DC
  Administered 2023-06-10 – 2023-06-11 (×3): 300 mg via ORAL
  Filled 2023-06-10 (×3): qty 1

## 2023-06-10 MED ORDER — ALBUMIN HUMAN 5 % IV SOLN
12.5000 g | Freq: Once | INTRAVENOUS | Status: AC
  Administered 2023-06-10: 12.5 g via INTRAVENOUS

## 2023-06-10 MED ORDER — CHLORHEXIDINE GLUCONATE CLOTH 2 % EX PADS: 6.0000 | MEDICATED_PAD | Freq: Once | CUTANEOUS | Status: DC

## 2023-06-10 MED ORDER — PROPOFOL 10 MG/ML IV BOLUS
INTRAVENOUS | Status: AC
  Filled 2023-06-10: qty 20

## 2023-06-10 MED ORDER — ROCURONIUM BROMIDE 10 MG/ML (PF) SYRINGE
PREFILLED_SYRINGE | INTRAVENOUS | Status: AC
  Filled 2023-06-10: qty 10

## 2023-06-10 MED ORDER — FENTANYL CITRATE (PF) 250 MCG/5ML IJ SOLN
INTRAMUSCULAR | Status: AC
  Filled 2023-06-10: qty 5

## 2023-06-10 MED ORDER — SODIUM CHLORIDE 0.9% FLUSH: 3.0000 mL | Freq: Two times a day (BID) | INTRAVENOUS | Status: DC

## 2023-06-10 MED ORDER — SODIUM CHLORIDE 0.9% FLUSH: 3.0000 mL | INTRAVENOUS | Status: DC | PRN

## 2023-06-10 MED ORDER — THROMBIN 5000 UNITS EX KIT
PACK | CUTANEOUS | Status: AC
  Filled 2023-06-10: qty 1

## 2023-06-10 MED ORDER — PHENYLEPHRINE 80 MCG/ML (10ML) SYRINGE FOR IV PUSH (FOR BLOOD PRESSURE SUPPORT)
PREFILLED_SYRINGE | INTRAVENOUS | Status: AC
Start: 2023-06-10 — End: ?
  Filled 2023-06-10: qty 10

## 2023-06-10 MED ORDER — CHLORHEXIDINE GLUCONATE 0.12 % MT SOLN
15.0000 mL | Freq: Once | OROMUCOSAL | Status: AC
  Administered 2023-06-10: 15 mL via OROMUCOSAL
  Filled 2023-06-10: qty 15

## 2023-06-10 MED ORDER — HYDROMORPHONE HCL 1 MG/ML IJ SOLN
INTRAMUSCULAR | Status: DC | PRN
  Administered 2023-06-10: .5 mg via INTRAVENOUS

## 2023-06-10 MED ORDER — AMISULPRIDE (ANTIEMETIC) 5 MG/2ML IV SOLN
10.0000 mg | Freq: Once | INTRAVENOUS | Status: AC
  Administered 2023-06-10: 10 mg via INTRAVENOUS

## 2023-06-10 MED ORDER — HYDROMORPHONE HCL 1 MG/ML IJ SOLN
INTRAMUSCULAR | Status: AC
  Filled 2023-06-10: qty 1

## 2023-06-10 MED ORDER — 0.9 % SODIUM CHLORIDE (POUR BTL) OPTIME
TOPICAL | Status: DC | PRN
  Administered 2023-06-10: 1000 mL

## 2023-06-10 MED ORDER — BUPIVACAINE-EPINEPHRINE (PF) 0.5% -1:200000 IJ SOLN
INTRAMUSCULAR | Status: AC
  Filled 2023-06-10: qty 30

## 2023-06-10 MED ORDER — THROMBIN 20000 UNITS EX SOLR
CUTANEOUS | Status: AC
  Filled 2023-06-10: qty 20000

## 2023-06-10 MED ORDER — AMISULPRIDE (ANTIEMETIC) 5 MG/2ML IV SOLN
INTRAVENOUS | Status: AC
  Filled 2023-06-10: qty 4

## 2023-06-10 MED ORDER — OXYCODONE HCL 5 MG PO TABS: 5.0000 mg | ORAL_TABLET | Freq: Once | ORAL | Status: DC | PRN

## 2023-06-10 MED ORDER — LACTATED RINGERS IV SOLN: INTRAVENOUS | Status: DC

## 2023-06-10 MED ORDER — DOCUSATE SODIUM 100 MG PO CAPS
100.0000 mg | ORAL_CAPSULE | Freq: Two times a day (BID) | ORAL | Status: DC
  Administered 2023-06-10 – 2023-06-11 (×2): 100 mg via ORAL
  Filled 2023-06-10 (×2): qty 1

## 2023-06-10 MED ORDER — PHENOL 1.4 % MT LIQD: 1.0000 | OROMUCOSAL | Status: DC | PRN

## 2023-06-10 MED ORDER — MENTHOL 3 MG MT LOZG: 1.0000 | LOZENGE | OROMUCOSAL | Status: DC | PRN

## 2023-06-10 MED ORDER — BUPIVACAINE LIPOSOME 1.3 % IJ SUSP
INTRAMUSCULAR | Status: DC | PRN
  Administered 2023-06-10: 20 mL

## 2023-06-10 MED ORDER — OXYCODONE HCL 5 MG/5ML PO SOLN: 5.0000 mg | Freq: Once | ORAL | Status: DC | PRN

## 2023-06-10 MED ORDER — MORPHINE SULFATE (PF) 2 MG/ML IV SOLN: 2.0000 mg | INTRAVENOUS | Status: DC | PRN

## 2023-06-10 MED ORDER — BISACODYL 10 MG RE SUPP: 10.0000 mg | Freq: Every day | RECTAL | Status: DC | PRN

## 2023-06-10 MED ORDER — ROCURONIUM BROMIDE 10 MG/ML (PF) SYRINGE
PREFILLED_SYRINGE | INTRAVENOUS | Status: AC
Start: 2023-06-10 — End: ?
  Filled 2023-06-10: qty 10

## 2023-06-10 MED ORDER — CEFAZOLIN SODIUM-DEXTROSE 2-4 GM/100ML-% IV SOLN
2.0000 g | INTRAVENOUS | Status: AC
  Administered 2023-06-10 (×2): 2 g via INTRAVENOUS
  Filled 2023-06-10: qty 100

## 2023-06-10 MED ORDER — OXYCODONE HCL 5 MG PO TABS
5.0000 mg | ORAL_TABLET | ORAL | Status: DC | PRN
  Administered 2023-06-11: 5 mg via ORAL
  Filled 2023-06-10: qty 1

## 2023-06-10 MED ORDER — DEXAMETHASONE SODIUM PHOSPHATE 10 MG/ML IJ SOLN
INTRAMUSCULAR | Status: DC | PRN
  Administered 2023-06-10: 10 mg via INTRAVENOUS

## 2023-06-10 MED ORDER — BACITRACIN ZINC 500 UNIT/GM EX OINT
TOPICAL_OINTMENT | CUTANEOUS | Status: DC | PRN
  Administered 2023-06-10: 1 via TOPICAL

## 2023-06-10 MED ORDER — ALBUMIN HUMAN 5 % IV SOLN
INTRAVENOUS | Status: AC
  Filled 2023-06-10: qty 250

## 2023-06-10 MED ORDER — LIDOCAINE 2% (20 MG/ML) 5 ML SYRINGE
INTRAMUSCULAR | Status: DC | PRN
  Administered 2023-06-10: 30 mg via INTRAVENOUS

## 2023-06-10 MED ORDER — MIDAZOLAM HCL 2 MG/2ML IJ SOLN
INTRAMUSCULAR | Status: DC | PRN
  Administered 2023-06-10: 2 mg via INTRAVENOUS

## 2023-06-10 MED ORDER — ZOLPIDEM TARTRATE 5 MG PO TABS: 5.0000 mg | ORAL_TABLET | Freq: Every evening | ORAL | Status: DC | PRN

## 2023-06-10 MED ORDER — SUGAMMADEX SODIUM 200 MG/2ML IV SOLN
INTRAVENOUS | Status: DC | PRN
  Administered 2023-06-10: 150 mg via INTRAVENOUS

## 2023-06-10 MED ORDER — BACITRACIN ZINC 500 UNIT/GM EX OINT
TOPICAL_OINTMENT | CUTANEOUS | Status: AC
  Filled 2023-06-10: qty 28.35

## 2023-06-10 MED ORDER — PHENYLEPHRINE 80 MCG/ML (10ML) SYRINGE FOR IV PUSH (FOR BLOOD PRESSURE SUPPORT)
PREFILLED_SYRINGE | INTRAVENOUS | Status: DC | PRN
  Administered 2023-06-10: 240 ug via INTRAVENOUS
  Administered 2023-06-10 (×2): 160 ug via INTRAVENOUS

## 2023-06-10 MED ORDER — ACETAMINOPHEN 650 MG RE SUPP: 650.0000 mg | RECTAL | Status: DC | PRN

## 2023-06-10 MED ORDER — DEXAMETHASONE SODIUM PHOSPHATE 10 MG/ML IJ SOLN
INTRAMUSCULAR | Status: AC
  Filled 2023-06-10: qty 1

## 2023-06-10 MED ORDER — HEMOSTATIC AGENTS (NO CHARGE) OPTIME
TOPICAL | Status: DC | PRN
  Administered 2023-06-10: 1 via TOPICAL

## 2023-06-10 MED ORDER — CEFAZOLIN SODIUM 1 G IJ SOLR
INTRAMUSCULAR | Status: AC
  Filled 2023-06-10: qty 20

## 2023-06-10 MED ORDER — HYDROMORPHONE HCL 1 MG/ML IJ SOLN
INTRAMUSCULAR | Status: AC
Start: 2023-06-10 — End: ?
  Filled 2023-06-10: qty 0.5

## 2023-06-10 MED ORDER — ROCURONIUM BROMIDE 10 MG/ML (PF) SYRINGE
PREFILLED_SYRINGE | INTRAVENOUS | Status: DC | PRN
  Administered 2023-06-10: 20 mg via INTRAVENOUS
  Administered 2023-06-10: 10 mg via INTRAVENOUS
  Administered 2023-06-10: 20 mg via INTRAVENOUS
  Administered 2023-06-10: 10 mg via INTRAVENOUS
  Administered 2023-06-10: 50 mg via INTRAVENOUS
  Administered 2023-06-10 (×3): 20 mg via INTRAVENOUS
  Administered 2023-06-10: 10 mg via INTRAVENOUS
  Administered 2023-06-10: 30 mg via INTRAVENOUS
  Administered 2023-06-10: 10 mg via INTRAVENOUS

## 2023-06-10 MED ORDER — MIDAZOLAM HCL 2 MG/2ML IJ SOLN
INTRAMUSCULAR | Status: AC
  Filled 2023-06-10: qty 2

## 2023-06-10 MED ORDER — BUPIVACAINE LIPOSOME 1.3 % IJ SUSP
INTRAMUSCULAR | Status: AC
  Filled 2023-06-10: qty 20

## 2023-06-10 MED ORDER — MIDAZOLAM HCL 2 MG/2ML IJ SOLN: 0.5000 mg | Freq: Once | INTRAMUSCULAR | Status: DC | PRN

## 2023-06-10 MED ORDER — OXYCODONE HCL 5 MG PO TABS
10.0000 mg | ORAL_TABLET | ORAL | Status: DC | PRN
  Administered 2023-06-10: 10 mg via ORAL
  Filled 2023-06-10: qty 2

## 2023-06-10 MED ORDER — SODIUM CHLORIDE 0.9 % IV SOLN
250.0000 mL | INTRAVENOUS | Status: DC
  Administered 2023-06-10: 250 mL via INTRAVENOUS

## 2023-06-10 MED ORDER — ACETAMINOPHEN 325 MG PO TABS: 650.0000 mg | ORAL_TABLET | ORAL | Status: DC | PRN

## 2023-06-10 MED ORDER — PROPOFOL 10 MG/ML IV BOLUS
INTRAVENOUS | Status: DC | PRN
  Administered 2023-06-10: 120 mg via INTRAVENOUS
  Administered 2023-06-10: 30 mg via INTRAVENOUS

## 2023-06-10 MED ORDER — CEFAZOLIN SODIUM-DEXTROSE 2-4 GM/100ML-% IV SOLN
2.0000 g | Freq: Three times a day (TID) | INTRAVENOUS | Status: AC
  Administered 2023-06-10 – 2023-06-11 (×2): 2 g via INTRAVENOUS
  Filled 2023-06-10 (×2): qty 100

## 2023-06-10 MED ORDER — MEPERIDINE HCL 25 MG/ML IJ SOLN: 6.2500 mg | INTRAMUSCULAR | Status: DC | PRN

## 2023-06-10 MED ORDER — SODIUM CHLORIDE (PF) 0.9 % IJ SOLN
INTRAMUSCULAR | Status: AC
  Filled 2023-06-10: qty 20

## 2023-06-10 MED ORDER — ORAL CARE MOUTH RINSE: 15.0000 mL | Freq: Once | OROMUCOSAL | Status: AC

## 2023-06-10 MED ORDER — ONDANSETRON HCL 4 MG/2ML IJ SOLN
INTRAMUSCULAR | Status: AC
  Filled 2023-06-10: qty 2

## 2023-06-10 MED ORDER — HYDROMORPHONE HCL 1 MG/ML IJ SOLN
0.2500 mg | INTRAMUSCULAR | Status: DC | PRN
  Administered 2023-06-10: 0.5 mg via INTRAVENOUS

## 2023-06-10 MED ORDER — THROMBIN 5000 UNITS EX SOLR
OROMUCOSAL | Status: DC | PRN
  Administered 2023-06-10 (×3): 5 mL via TOPICAL

## 2023-06-10 MED ORDER — ALBUMIN HUMAN 5 % IV SOLN: INTRAVENOUS | Status: DC | PRN

## 2023-06-10 MED ORDER — FENTANYL CITRATE (PF) 250 MCG/5ML IJ SOLN
INTRAMUSCULAR | Status: DC | PRN
  Administered 2023-06-10 (×2): 50 ug via INTRAVENOUS
  Administered 2023-06-10: 150 ug via INTRAVENOUS

## 2023-06-10 MED ORDER — ACETAMINOPHEN 500 MG PO TABS
1000.0000 mg | ORAL_TABLET | Freq: Four times a day (QID) | ORAL | Status: DC
  Administered 2023-06-10 – 2023-06-11 (×3): 1000 mg via ORAL
  Filled 2023-06-10 (×3): qty 2

## 2023-06-10 MED ORDER — ACETAMINOPHEN 500 MG PO TABS
1000.0000 mg | ORAL_TABLET | Freq: Once | ORAL | Status: AC
  Administered 2023-06-10: 1000 mg via ORAL
  Filled 2023-06-10: qty 2

## 2023-06-10 MED ORDER — CYCLOBENZAPRINE HCL 10 MG PO TABS: 10.0000 mg | ORAL_TABLET | Freq: Three times a day (TID) | ORAL | Status: DC | PRN

## 2023-06-10 MED ORDER — ONDANSETRON HCL 4 MG/2ML IJ SOLN
INTRAMUSCULAR | Status: DC | PRN
  Administered 2023-06-10: 4 mg via INTRAVENOUS

## 2023-06-10 SURGICAL SUPPLY — 68 items
BAG COUNTER SPONGE SURGICOUNT (BAG) ×1 IMPLANT
BASKET BONE COLLECTION (BASKET) ×1 IMPLANT
BENZOIN TINCTURE PRP APPL 2/3 (GAUZE/BANDAGES/DRESSINGS) ×1 IMPLANT
BLADE CLIPPER SURG (BLADE) IMPLANT
BLADE SURG 15 STRL SS SAFETY (BLADE) IMPLANT
BUR MATCHSTICK NEURO 3.0 LAGG (BURR) ×1 IMPLANT
BUR PRECISION FLUTE 6.0 (BURR) ×1 IMPLANT
CAGE ALTERA 10X31X9-13 15D (Cage) IMPLANT
CANISTER SUCT 3000ML PPV (MISCELLANEOUS) ×1 IMPLANT
CAP LOCK DLX THRD (Cap) IMPLANT
CLEANSER WND VASHE INSTL 34OZ (WOUND CARE) ×1 IMPLANT
CNTNR URN SCR LID CUP LEK RST (MISCELLANEOUS) ×1 IMPLANT
COVER BACK TABLE 60X90IN (DRAPES) ×1 IMPLANT
DRAPE C-ARM 42X72 X-RAY (DRAPES) ×2 IMPLANT
DRAPE HALF SHEET 40X57 (DRAPES) ×1 IMPLANT
DRAPE LAPAROTOMY 100X72X124 (DRAPES) ×1 IMPLANT
DRAPE SURG 17X23 STRL (DRAPES) ×1 IMPLANT
DRSG OPSITE POSTOP 4X6 (GAUZE/BANDAGES/DRESSINGS) ×1 IMPLANT
DRSG OPSITE POSTOP 4X8 (GAUZE/BANDAGES/DRESSINGS) IMPLANT
ELECTRODE BLDE 4.0 EZ CLN MEGD (MISCELLANEOUS) ×1 IMPLANT
ELECTRODE REM PT RTRN 9FT ADLT (ELECTROSURGICAL) ×1 IMPLANT
EVACUATOR 1/8 PVC DRAIN (DRAIN) IMPLANT
GAUZE 4X4 16PLY ~~LOC~~+RFID DBL (SPONGE) ×1 IMPLANT
GLOVE BIO SURGEON STRL SZ 6 (GLOVE) ×1 IMPLANT
GLOVE BIO SURGEON STRL SZ8 (GLOVE) ×2 IMPLANT
GLOVE BIO SURGEON STRL SZ8.5 (GLOVE) ×2 IMPLANT
GLOVE BIOGEL PI IND STRL 6.5 (GLOVE) ×1 IMPLANT
GLOVE BIOGEL PI IND STRL 7.5 (GLOVE) IMPLANT
GLOVE ECLIPSE 7.5 STRL STRAW (GLOVE) IMPLANT
GLOVE EXAM NITRILE XL STR (GLOVE) IMPLANT
GLOVE SURG SS PI 7.0 STRL IVOR (GLOVE) IMPLANT
GOWN STRL REUS W/ TWL LRG LVL3 (GOWN DISPOSABLE) ×1 IMPLANT
GOWN STRL REUS W/ TWL XL LVL3 (GOWN DISPOSABLE) ×2 IMPLANT
GOWN STRL REUS W/TWL 2XL LVL3 (GOWN DISPOSABLE) IMPLANT
HEMOSTAT POWDER KIT SURGIFOAM (HEMOSTASIS) ×1 IMPLANT
KIT BASIN OR (CUSTOM PROCEDURE TRAY) ×1 IMPLANT
KIT GRAFTMAG DEL NEURO DISP (NEUROSURGERY SUPPLIES) IMPLANT
KIT POSITION SURG JACKSON T1 (MISCELLANEOUS) ×1 IMPLANT
KIT TURNOVER KIT B (KITS) ×1 IMPLANT
NDL HYPO 21X1.5 SAFETY (NEEDLE) ×1 IMPLANT
NDL HYPO 22X1.5 SAFETY MO (MISCELLANEOUS) ×1 IMPLANT
NEEDLE HYPO 21X1.5 SAFETY (NEEDLE) ×1 IMPLANT
NEEDLE HYPO 22X1.5 SAFETY MO (MISCELLANEOUS) ×1 IMPLANT
NS IRRIG 1000ML POUR BTL (IV SOLUTION) ×1 IMPLANT
PACK LAMINECTOMY NEURO (CUSTOM PROCEDURE TRAY) ×1 IMPLANT
PAD ARMBOARD POSITIONER FOAM (MISCELLANEOUS) ×3 IMPLANT
PATTIES SURGICAL .5 X.5 (GAUZE/BANDAGES/DRESSINGS) ×1 IMPLANT
PATTIES SURGICAL .5 X1 (DISPOSABLE) IMPLANT
PATTIES SURGICAL 1X1 (DISPOSABLE) ×1 IMPLANT
PUTTY DBM 10CC CALC GRAN (Putty) IMPLANT
ROD CURVED TI 6.35X85 (Rod) IMPLANT
SCREW PA DLX CREO 7.5X50 (Screw) IMPLANT
SPACER ALTERA 10X31 8-12MM-8 (Spacer) IMPLANT
SPIKE FLUID TRANSFER (MISCELLANEOUS) ×1 IMPLANT
SPONGE NEURO XRAY DETECT 1X3 (DISPOSABLE) IMPLANT
SPONGE SURGIFOAM ABS GEL 100 (HEMOSTASIS) IMPLANT
SPONGE T-LAP 4X18 ~~LOC~~+RFID (SPONGE) IMPLANT
STRIP CLOSURE SKIN 1/2X4 (GAUZE/BANDAGES/DRESSINGS) ×1 IMPLANT
SUT PROLENE 6 0 BV (SUTURE) IMPLANT
SUT VIC AB 1 CT1 18XBRD ANBCTR (SUTURE) ×2 IMPLANT
SUT VIC AB 2-0 CP2 18 (SUTURE) ×2 IMPLANT
SYR 20ML LL LF (SYRINGE) IMPLANT
SYR 30ML SLIP (SYRINGE) ×1 IMPLANT
TOWEL GREEN STERILE (TOWEL DISPOSABLE) ×1 IMPLANT
TOWEL GREEN STERILE FF (TOWEL DISPOSABLE) ×1 IMPLANT
TRAY FOLEY MTR SLVR 14FR STAT (SET/KITS/TRAYS/PACK) IMPLANT
TRAY FOLEY MTR SLVR 16FR STAT (SET/KITS/TRAYS/PACK) ×1 IMPLANT
WATER STERILE IRR 1000ML POUR (IV SOLUTION) ×1 IMPLANT

## 2023-06-10 NOTE — Progress Notes (Signed)
 Orthopedic Tech Progress Note Patient Details:  Vanessa Dominguez [birth year] [id number]  RN stated "patient has back brace "  Patient ID: Vanessa Dominguez, female   DOB: Feb 15, 1945, 78 y.o.   MRN: [id number]  Kermitt Pedlar 06/10/2023, 7:14 PM

## 2023-06-10 NOTE — H&P (Signed)
 Subjective: The patient is a 78 year old white female who is complaining of back and left greater than right leg pain numbness, tingling and weakness consistent with neurogenic claudication.  She failed medical management.  She was worked up with lumbar x-rays and lumbar MRI which demonstrated lumbar spine listhesis, spinal stenosis, etc.  I discussed the various treatment options with her.  She has decided proceed with surgery.  Past Medical History:  Diagnosis Date   Arthritis    GERD (gastroesophageal reflux disease)    Left bundle branch block    Chronic   Pneumonia     Past Surgical History:  Procedure Laterality Date   CATARACT EXTRACTION W/PHACO Left 12/13/2015   Procedure: CATARACT EXTRACTION PHACO AND INTRAOCULAR LENS PLACEMENT (IOC);  Surgeon: Clair Crews, MD;  Location: [facility] ORS;  Service: Ophthalmology;  Laterality: Left;  Lot# [id number] H US :00:36.0 AP%: 23.7 CDE: 8.52   CATARACT EXTRACTION W/PHACO Right 01/10/2016   Procedure: CATARACT EXTRACTION PHACO AND INTRAOCULAR LENS PLACEMENT (IOC);  Surgeon: Clair Crews, MD;  Location: [facility] ORS;  Service: Ophthalmology;  Laterality: Right;  Lot # [id number] H US : 00:38.4 AP%: 21.3 CDE: 8.17   CESAREAN SECTION     EYE SURGERY     JOINT REPLACEMENT Left 02/06/2004   Lt knee   KNEE SURGERY Left [phone]   TUBAL LIGATION  02/06/1987    No Known Allergies  Social History   Tobacco Use   Smoking status: Former    Current packs/day: 0.00    Average packs/day: 0.1 packs/day for 54.6 years (5.5 ttl pk-yrs)    Types: Cigarettes    Start date: 74    Quit date: 09/2022    Years since quitting: 0.7   Smokeless tobacco: Never   Tobacco comments:    05/08/23 2-3 cig/day when smoking  Substance Use Topics   Alcohol use: No    Family History  Problem Relation Age of Onset   Diabetes Mother    Cancer Father    Heart attack Brother        10/2016   Breast cancer Neg Hx    Prior to Admission medications    Medication Sig Start Date End Date Taking? Authorizing Provider  CALCIUM PO Take 1 tablet by mouth daily.   Yes [provider]  gabapentin  (NEURONTIN ) 300 MG capsule TAKE 1 CAPSULE 3 TIMES DAILY 07/28/20  Yes Bacigalupo, Angela M, MD  MAGNESIUM PO Take 1 tablet by mouth daily.   Yes [provider]  vitamin E 180 MG (400 UNITS) capsule Take 400 Units by mouth daily.   Yes [provider]  lidocaine  (LIDODERM ) 5 % Place 1 patch onto the skin every 12 (twelve) hours. Remove & Discard patch within 12 hours or as directed by MD Patient not taking: Reported on 07/17/2022 06/08/22   Jacquie Maudlin, MD  omeprazole  (PRILOSEC) 20 MG capsule Take 1 capsule (20 mg total) by mouth daily. Patient not taking: Reported on 05/06/2023 04/28/21   Trenton Frock, [name]     Review of Systems  Positive ROS: As above  All other systems have been reviewed and were otherwise negative with the exception of those mentioned in the HPI and as above.  Objective: Vital signs in last 24 hours: Temp:  [97.8 F (36.6 C)] 97.8 F (36.6 C) (05/05 0547) Pulse Rate:  [93] 93 (05/05 0547) Resp:  [18] 18 (05/05 0547) BP: (147)/(83) 147/83 (05/05 0547) SpO2:  [100 %] 100 % (05/05 0547) Weight:  [61.2 kg] 61.2 kg (05/05 0547) Estimated body  mass index is 21.14 kg/m as calculated from the following:   Height as of this encounter: 5\' 7"  (1.702 m).   Weight as of this encounter: 61.2 kg.   General Appearance: Alert Head: Normocephalic, without obvious abnormality, atraumatic Eyes: PERRL, conjunctiva/corneas clear, EOM's intact,    Ears: Normal  Throat: Normal  Neck: Supple, Back: unremarkable Lungs: Clear to auscultation bilaterally, respirations unlabored Heart: Regular rate and rhythm, no murmur, rub or gallop Abdomen: Soft, non-tender Extremities: Extremities normal, atraumatic, no cyanosis or edema Skin: unremarkable  NEUROLOGIC:   Mental status: alert and oriented,Motor Exam -the  patient has a left foot drop sensory Exam - grossly normal Reflexes:  Coordination - grossly normal Gait - grossly normal Balance - grossly normal Cranial Nerves: I: smell Not tested  II: visual acuity  OS: Normal  OD: Normal   II: visual fields Full to confrontation  II: pupils Equal, round, reactive to light  III,VII: ptosis None  III,IV,VI: extraocular muscles  Full ROM  V: mastication Normal  V: facial light touch sensation  Normal  V,VII: corneal reflex  Present  VII: facial muscle function - upper  Normal  VII: facial muscle function - lower Normal  VIII: hearing Not tested  IX: soft palate elevation  Normal  IX,X: gag reflex Present  XI: trapezius strength  5/5  XI: sternocleidomastoid strength 5/5  XI: neck flexion strength  5/5  XII: tongue strength  Normal    Data Review Lab Results  Component Value Date   WBC 5.0 06/10/2023   HGB 13.5 06/10/2023   HCT 41.1 06/10/2023   MCV 92.8 06/10/2023   PLT 319 06/10/2023   Lab Results  Component Value Date   NA 139 06/10/2023   K 5.0 06/10/2023   CL 102 06/10/2023   CO2 26 06/10/2023   BUN 15 06/10/2023   CREATININE 0.71 06/10/2023   GLUCOSE 104 (H) 06/10/2023   No results found for: "INR", "PROTIME"  Assessment/Plan: Lumbar spondylolisthesis, lumbar spinal stenosis, lumbar facet arthropathy, neurogenic claudication, lumbago, lumbar radiculopathy: I have discussed the situation with the patient.  I reviewed her imaging studies with her and pointed out the abnormalities.  We have discussed the various treatment options including surgery.  I have described the surgical treatment option of an L2-3, L3-4 and L4-5 decompression, instrumentation and fusion.  I have shown her surgical models.  I have given her a surgical pamphlet.  We have discussed the risk, benefits, alternatives, expected postoperative course, and the likelihood of achieving our goals with surgery.  I have answered all her questions.  She has decided  proceed with surgery.  [name] 06/10/2023 7:24 AM

## 2023-06-10 NOTE — Op Note (Signed)
 Brief history: The patient is a 78 year old white female who is complaining of back and bilateral buttock leg pain numbness tingling weakness consistent with neurogenic claudication.  She has failed medical management and was worked up with a lumbar MRI lumbar x-rays which demonstrated lumbar spondylolisthesis, spinal stenosis, etc.  I discussed the various treatment options with her.  She has decided proceed with surgery.  Preoperative diagnosis: Lumbar spondylolisthesis, degenerative disc disease, spinal stenosis compressing L3, L4 and L5 nerve roots; lumbago; lumbar radiculopathy; neurogenic claudication  Postoperative diagnosis: The same  Procedure: Bilateral L2-3, 3 4 and L4-5 laminotomy/foraminotomies/medial facetectomy to decompress the bilateral L3, L4 and L5 nerve roots(the work required to do this was in addition to the work required to do the posterior lumbar interbody fusion because of the patient's spinal stenosis, facet arthropathy. Etc. requiring a wide decompression of the nerve roots.); L2-3, L3-4 and L4-5 transforaminal lumbar interbody fusion with local morselized autograft bone and Zimmer DBM; insertion of interbody prosthesis at L2-3, L3-4 and L4-5 (globus peek expandable interbody prosthesis); posterior segmental instrumentation from L2 to L5 with globus titanium pedicle screws and rods; posterior lateral arthrodesis at L2-3, 3 4 and L4-5 with local morselized autograft bone and Zimmer DBM.  Surgeon: Dr. Pleasant Brilliant  Asst.:None  Anesthesia: Gen. endotracheal  Estimated blood loss: 300 cc  Drains: 1 medium Hemovac drain in the epidural space.  Complications: None  Description of procedure: The patient was brought to the operating room by the anesthesia team. General endotracheal anesthesia was induced. The patient was turned to the prone position on the Wilson frame. The patient's lumbosacral region was then prepared with Betadine  scrub and Betadine  solution. Sterile drapes  were applied.  I then injected the area to be incised with Marcaine with epinephrine  solution. I then used the scalpel to make a linear midline incision over the L2-3, L3-4 and L4-5 interspace. I then used electrocautery to perform a bilateral subperiosteal dissection exposing the spinous process and lamina of L2-3, L3-4 and L4-5. We then obtained intraoperative radiograph to confirm our location. We then inserted the Verstrac retractor to provide exposure.  I began the decompression by using the high speed drill to perform laminotomies at L2-3, L3-4 and L4-5 bilaterally. We then used the Kerrison punches to widen the laminotomy and removed the ligamentum flavum at L2-3, L3-4 and L4-5 bilaterally. We used the Kerrison punches to remove the medial facets at L2-3, L3-4 and L4-5 bilaterally, we removed the left L2-3, L3-4 and L4-5 facets. We performed wide foraminotomies about the bilateral  L3, L4 and L5 nerve roots completing the decompression.  We now turned our attention to the posterior lumbar interbody fusion. I used a scalpel to incise the intervertebral disc at L2-3, L3-4 and L4-5 bilaterally. I then performed a partial intervertebral discectomy at L2-3, L3-4 and L4-5 bilaterally using the pituitary forceps. We prepared the vertebral endplates at L2-3, L3-4 and L4-5 bilaterally for the fusion by removing the soft tissues with the curettes. We then used the trial spacers to pick the appropriate sized interbody prosthesis. We prefilled his prosthesis with a combination of local morselized autograft bone that we obtained during the decompression as well as Zimmer DBM. We inserted the prefilled prosthesis into the interspace at L2-3, L3-4 and L4-5 from the left, we then turned and expanded the prosthesis. There was a good snug fit of the prosthesis in the interspace. We then filled and the remainder of the intervertebral disc space with local morselized autograft bone and Zimmer DBM.  This completed the  posterior lumbar interbody arthrodesis.  During the decompression and insertion of the prosthesis the assistant protected the thecal sac and nerve roots with the D'Errico retractor.  We now turned attention to the instrumentation. Under fluoroscopic guidance we cannulated the bilateral L2, L3, L4 and L5 pedicles with the bone probe. We then removed the bone probe. We then tapped the pedicle with a 6.5 millimeter tap. We then removed the tap. We probed inside the tapped pedicle with a ball probe to rule out cortical breaches. We then inserted a 7.5 x 50 millimeter pedicle screw into the L2, L3, L4, and L5 pedicles bilaterally under fluoroscopic guidance. We then palpated along the medial aspect of the pedicles to rule out cortical breaches. There were none. The nerve roots were not injured. We then connected the unilateral pedicle screws with a lordotic rod. We compressed the construct and secured the rod in place with the caps. We then tightened the caps appropriately. This completed the instrumentation from L2 and L5 bilaterally.  We now turned our attention to the posterior lateral arthrodesis at L2-3, L3-4 and L4-5. We used the high-speed drill to decorticate the remainder of the facets, pars, transverse process at L2-3, 3 4 and L4-5. We then applied a combination of local morselized autograft bone and Zimmer DBM over these decorticated posterior lateral structures. This completed the posterior lateral arthrodesis.  We then obtained hemostasis using bipolar electrocautery. We irrigated the wound out with vashe solution. We inspected the thecal sac and nerve roots and noted they were well decompressed. We then removed the retractor.  We injected Exparel .  We placed a medium Hemovac drain in the epidural space and tunneled it out through a separate stab wound.  We reapproximated patient's thoracolumbar fascia with interrupted #1 Vicryl suture. We reapproximated patient's subcutaneous tissue with interrupted  2-0 Vicryl suture. The reapproximated patient's skin with Steri-Strips and benzoin. The wound was then coated with bacitracin ointment. A sterile dressing was applied. The drapes were removed. The patient was subsequently returned to the supine position where they were extubated by the anesthesia team. He was then transported to the post anesthesia care unit in stable condition. All sponge instrument and needle counts were reportedly correct at the end of this case.

## 2023-06-10 NOTE — Anesthesia Postprocedure Evaluation (Signed)
 Anesthesia Post Note  Patient: Brayton Calin  Procedure(s) Performed: POSTERIOR LUMBAR INTERBODY FUSION LUMBAR TWO-THREE,LUMBAR THREE-FOUR,LUMBAR FOUR-FIVE POSTERIOR INSRUMENTATION     Patient location during evaluation: PACU Anesthesia Type: General Level of consciousness: sedated, patient cooperative and oriented Pain management: pain level controlled Vital Signs Assessment: post-procedure vital signs reviewed and stable Respiratory status: spontaneous breathing, nonlabored ventilation and respiratory function stable Cardiovascular status: blood pressure returned to baseline and stable Postop Assessment: no apparent nausea or vomiting Anesthetic complications: no   No notable events documented.  Last Vitals:  Vitals:   06/10/23 1645 06/10/23 1700  BP: 120/60 (!) 130/95  Pulse: 100 99  Resp: 10 14  Temp:  36.7 C  SpO2: 93% 94%    Last Pain:  Vitals:   06/10/23 1645  TempSrc:   PainSc: 0-No pain                 Tearsa Kowalewski,E. Sue Mcalexander

## 2023-06-10 NOTE — Transfer of Care (Signed)
 Immediate Anesthesia Transfer of Care Note  Patient: Vanessa Dominguez  Procedure(s) Performed: POSTERIOR LUMBAR INTERBODY FUSION LUMBAR TWO-THREE,LUMBAR THREE-FOUR,LUMBAR FOUR-FIVE POSTERIOR INSRUMENTATION  Patient Location: PACU  Anesthesia Type:General  Level of Consciousness: awake and alert   Airway & Oxygen Therapy: Patient Spontanous Breathing and Patient connected to face mask oxygen  Post-op Assessment: Report given to RN, Post -op Vital signs reviewed and stable, Patient moving all extremities, and Patient moving all extremities X 4  Post vital signs: Reviewed and stable  Last Vitals:  Vitals Value Taken Time  BP 101/65 06/10/23 1515  Temp    Pulse 108 06/10/23 1519  Resp 16 06/10/23 1519  SpO2 99 % 06/10/23 1519  Vitals shown include unfiled device data.  Last Pain:  Vitals:   06/10/23 0622  TempSrc:   PainSc: 8       Patients Stated Pain Goal: 5 (06/10/23 0622)  Complications: No notable events documented.

## 2023-06-10 NOTE — Anesthesia Procedure Notes (Signed)
 Procedure Name: Intubation Date/Time: 06/10/2023 7:54 AM  Performed by: Johann Muta, CRNAPre-anesthesia Checklist: Patient identified, Emergency Drugs available, Suction available and Patient being monitored Patient Re-evaluated:Patient Re-evaluated prior to induction Oxygen Delivery Method: Circle System Utilized Preoxygenation: Pre-oxygenation with 100% oxygen Induction Type: IV induction Ventilation: Mask ventilation without difficulty Laryngoscope Size: Mac and 3 Grade View: Grade I Tube type: Oral Tube size: 7.0 mm Number of attempts: 1 Airway Equipment and Method: Stylet and Oral airway Placement Confirmation: ETT inserted through vocal cords under direct vision, positive ETCO2 and breath sounds checked- equal and bilateral Secured at: 22 cm Tube secured with: Tape Dental Injury: Teeth and Oropharynx as per pre-operative assessment

## 2023-06-11 ENCOUNTER — Encounter (HOSPITAL_COMMUNITY): Payer: Self-pay | Admitting: Neurosurgery

## 2023-06-11 DIAGNOSIS — G9762 Postprocedural hematoma of a nervous system organ or structure following other procedure: Secondary | ICD-10-CM | POA: Diagnosis not present

## 2023-06-11 MED ORDER — CYCLOBENZAPRINE HCL 5 MG PO TABS: 5.0000 mg | ORAL_TABLET | Freq: Three times a day (TID) | ORAL | 0 refills | Status: DC | PRN

## 2023-06-11 MED ORDER — OXYCODONE-ACETAMINOPHEN 5-325 MG PO TABS
1.0000 | ORAL_TABLET | ORAL | 0 refills | Status: DC | PRN
Start: 2023-06-11 — End: 2023-06-17

## 2023-06-11 MED ORDER — CYCLOBENZAPRINE HCL 5 MG PO TABS
5.0000 mg | ORAL_TABLET | Freq: Three times a day (TID) | ORAL | Status: DC | PRN
Start: 2023-06-11 — End: 2023-06-11

## 2023-06-11 MED ORDER — DOCUSATE SODIUM 100 MG PO CAPS: 100.0000 mg | ORAL_CAPSULE | Freq: Two times a day (BID) | ORAL | 0 refills | Status: DC

## 2023-06-11 MED ORDER — OXYCODONE-ACETAMINOPHEN 5-325 MG PO TABS
1.0000 | ORAL_TABLET | ORAL | Status: DC | PRN
  Administered 2023-06-11: 1 via ORAL
  Filled 2023-06-11: qty 1

## 2023-06-11 MED FILL — Sodium Chloride IV Soln 0.9%: INTRAVENOUS | Qty: 2000 | Status: AC

## 2023-06-11 MED FILL — Thrombin For Soln 5000 Unit: CUTANEOUS | Qty: 5000 | Status: AC

## 2023-06-11 MED FILL — Heparin Sodium (Porcine) Inj 1000 Unit/ML: INTRAMUSCULAR | Qty: 30 | Status: AC

## 2023-06-11 NOTE — TOC Transition Note (Addendum)
 Transition of Care [facility]) - Discharge Note   Patient Details  Name: Vanessa Dominguez MRN: [id number] Date of Birth: Mar 26, 1945  Transition of Care [facility]) CM/SW Contact:  Tom-Johnson, Angelique Ken, RN Phone Number: 06/11/2023, 9:48 AM   Clinical Narrative:     Patient is scheduled for discharge today.  Hospital f/u and discharge instructions on AVS. Unit to provide necessary DME's at discharge. No TOC needs or recommendations noted. Husband, Royston Cornea to transport at discharge.  No further TOC needs noted.       Final next level of care: Home/Self Care Barriers to Discharge: Barriers Resolved   Patient Goals and CMS Choice Patient states their goals for this hospitalization and ongoing recovery are:: To return home CMS Medicare.gov Compare Post Acute Care list provided to:: Patient Choice offered to / list presented to : NA      Discharge Placement                Patient to be transferred to facility by: Husband Name of family member notified: Royston Cornea    Discharge Plan and Services Additional resources added to the After Visit Summary for                    DME Agency: NA       HH Arranged: NA HH Agency: NA        Social Drivers of Health (SDOH) Interventions SDOH Screenings   Food Insecurity: No Food Insecurity (05/08/2023)  Housing: Low Risk  (05/08/2023)  Transportation Needs: No Transportation Needs (05/08/2023)  Utilities: Not At Risk (05/08/2023)  Alcohol Screen: Low Risk  (05/08/2023)  Depression (PHQ2-9): Low Risk  (05/08/2023)  Financial Resource Strain: Low Risk  (05/08/2023)  Physical Activity: Sufficiently Active (05/08/2023)  Social Connections: Unknown (05/08/2023)  Stress: No Stress Concern Present (05/08/2023)  Tobacco Use: Medium Risk (06/10/2023)  Health Literacy: Adequate Health Literacy (05/08/2023)     Readmission Risk Interventions     No data to display

## 2023-06-11 NOTE — Evaluation (Signed)
 Occupational Therapy Evaluation and Discharge Patient Details Name: Vanessa Dominguez MRN: [id number] DOB: 1945-03-11 Today's Date: 06/11/2023   History of Present Illness   Pt is a 78 y/o female who presents s/p L2-L5 PLIF on 06/10/2023. PMH significant for LBBB, L TKA.     Clinical Impressions This 78 yo female admitted and underwent above presents to acute OT with all education completed with pt and post op back handout given and gone over. No further OT needs, we will sign off      If plan is discharge home, recommend the following:   A little help with walking and/or transfers;A little help with bathing/dressing/bathroom;Assistance with cooking/housework;Assist for transportation;Help with stairs or ramp for entrance     Functional Status Assessment   Patient has had a recent decline in their functional status and demonstrates the ability to make significant improvements in function in a reasonable and predictable amount of time. (without further skilled OT needs)     Equipment Recommendations   None recommended by OT      Precautions/Restrictions   Precautions Precautions: Fall;Back Precaution Booklet Issued: Yes (comment) Required Braces or Orthoses: Spinal Brace Spinal Brace: Lumbar corset;Applied in sitting position Restrictions Weight Bearing Restrictions Per Provider Order: No     Mobility Bed Mobility Overal bed mobility: Modified Independent (increased time)                  Transfers Overall transfer level: Needs assistance Equipment used: Rolling walker (2 wheels) Transfers: Sit to/from Stand Sit to Stand: Supervision           General transfer comment: VCs for safe hand placement and to look up for sit<>stand      Balance Overall balance assessment: Mild deficits observed, not formally tested, Needs assistance (in standing)                                         ADL either performed or assessed with clinical  judgement   ADL                                         General ADL Comments: Educated pt on use of pillows for positioning in bed, building up sitting tolerance, sequence of dressing, use of 2 cups for mouth care, use of wet wipes for back peri care, log roll for OOB, looking forward or up for sit<>stand, and BLT (no bending, lifting, twisiting). Also educated on use fo AE for LBD.     Vision Patient Visual Report: No change from baseline              Pertinent Vitals/Pain Pain Assessment Pain Assessment: 0-10 Pain Score: 7  Pain Location: Incision site Pain Descriptors / Indicators: Operative site guarding, Sore Pain Intervention(s): Limited activity within patient's tolerance, Monitored during session, Repositioned, Patient requesting pain meds-RN notified, RN gave pain meds during session     Extremity/Trunk Assessment Upper Extremity Assessment Upper Extremity Assessment: Right hand dominant;LUE deficits/detail LUE Deficits / Details: Pt reports prior to surgery her LUE was working completely normal her RUE, but not it feels weak. She is 4/5 throughout and you can visibly see the strength and coordination deficit when she raises her LUE compared to her RUE and in difficulty with pulling pants up. Encouraged her to continue  to use her LUE functionally and to let her surgeon know at post op visit if it is still not normal LUE Coordination: decreased fine motor;decreased gross motor      Communication Communication Communication: No apparent difficulties Factors Affecting Communication: Hearing impaired   Cognition Arousal: Alert Behavior During Therapy: WFL for tasks assessed/performed Cognition: No apparent impairments                               Following commands: Intact       Cueing   Cueing Techniques: Verbal cues              Home Living Family/patient expects to be discharged to:: Private residence Living Arrangements:  Spouse/significant other Available Help at Discharge: Family;Available 24 hours/day Type of Home: House Home Access: Stairs to enter [employer] of Steps: 2 Entrance Stairs-Rails: None Home Layout: Two level;Able to live on main level with bedroom/bathroom Alternate Level Stairs-Number of Steps: flgiht   Bathroom Shower/Tub: Producer, television/film/video: Handicapped height     Home Equipment: Agricultural consultant (2 wheels);Shower seat - built in;Toilet riser          Prior Functioning/Environment Prior Level of Function : Independent/Modified Independent;Driving                    OT Problem List: Decreased strength;Decreased range of motion;Impaired balance (sitting and/or standing);Pain;Impaired UE functional use        OT Goals(Current goals can be found in the care plan section)   Acute Rehab OT Goals Patient Stated Goal: to go home today         AM-PAC OT "6 Clicks" Daily Activity     Outcome Measure Help from another person eating meals?: None Help from another person taking care of personal grooming?: A Little Help from another person toileting, which includes using toliet, bedpan, or urinal?: A Little Help from another person bathing (including washing, rinsing, drying)?: A Little Help from another person to put on and taking off regular upper body clothing?: A Little Help from another person to put on and taking off regular lower body clothing?: A Little 6 Click Score: 19   End of Session Equipment Utilized During Treatment: Back brace Nurse Communication: Mobility status (no further OT needs, pt requesing pain med)  Activity Tolerance: Patient tolerated treatment well Patient left: in bed  OT Visit Diagnosis: Unsteadiness on feet (R26.81);Pain Pain - part of body:  (incisional)                Time: 4010-2725 OT Time Calculation (min): 29 min Charges:  OT General Charges $OT Visit: 1 Visit OT Evaluation $OT Eval Moderate Complexity:  1 Mod OT Treatments $Self Care/Home Management : 8-22 mins  Vanessa Dominguez OT Acute Rehabilitation Services Office [phone]    Vanessa Dominguez 06/11/2023, 11:27 AM

## 2023-06-11 NOTE — Evaluation (Signed)
 Physical Therapy Evaluation  Patient Details Name: Vanessa Dominguez MRN: [id number] DOB: 10-Aug-1945 Today's Date: 06/11/2023  History of Present Illness  Pt is a 78 y/o female who presents s/p L2-L5 PLIF on 06/10/2023. PMH significant for LBBB, L TKA.  Clinical Impression  Pt admitted with above diagnosis. At the time of PT eval, pt was able to demonstrate transfers and ambulation with gross CGA to supervision for safety and RW for support. Pt was educated on precautions, brace application/wearing schedule, appropriate activity progression, and car transfer. Pt currently with functional limitations due to the deficits listed below (see PT Problem List). Pt will benefit from skilled PT to increase their independence and safety with mobility to allow discharge to the venue listed below.         If plan is discharge home, recommend the following: A little help with walking and/or transfers;A little help with bathing/dressing/bathroom;Assistance with cooking/housework;Assist for transportation;Help with stairs or ramp for entrance   Can travel by private vehicle        Equipment Recommendations None recommended by PT  Recommendations for Other Services       Functional Status Assessment Patient has had a recent decline in their functional status and demonstrates the ability to make significant improvements in function in a reasonable and predictable amount of time.     Precautions / Restrictions Precautions Precautions: Fall;Back Precaution Booklet Issued: Yes (comment) Recall of Precautions/Restrictions: Intact Precaution/Restrictions Comments: Reviewed handout and pt was cued for precautions during functional mobility. Required Braces or Orthoses: Spinal Brace Spinal Brace: Lumbar corset;Applied in sitting position Restrictions Weight Bearing Restrictions Per Provider Order: No      Mobility  Bed Mobility Overal bed mobility: Needs Assistance Bed Mobility: Rolling, Sidelying to  Sit Rolling: Supervision Sidelying to sit: Supervision       General bed mobility comments: Slow and guarded but able to complete without assist. VC's for optimal log roll technique. HOB flat and rails lowered to simulate home environment.    Transfers Overall transfer level: Needs assistance Equipment used: Rolling walker (2 wheels) Transfers: Sit to/from Stand Sit to Stand: Contact guard assist           General transfer comment: Hands on guarding for safety as pt powered up to full stand. Pt demonstrated good hand placement on seated surface for safety.    Ambulation/Gait Ambulation/Gait assistance: Contact guard assist Gait Distance (Feet): 200 Feet Assistive device: Rolling walker (2 wheels) Gait Pattern/deviations: Step-through pattern, Decreased stride length, Trunk flexed Gait velocity: Decreased Gait velocity interpretation: 1.31 - 2.62 ft/sec, indicative of limited community ambulator   General Gait Details: VC's for improved posture, closer walker proximity and forward gaze. No assist required and no overt LOB noted.  Stairs Stairs: Yes Stairs assistance: Contact guard assist Stair Management: One rail Right, Step to pattern, Forwards Number of Stairs: 2 General stair comments: VC's for sequencing and general safety.  Wheelchair Mobility     Tilt Bed    Modified Rankin (Stroke Patients Only)       Balance Overall balance assessment: Mild deficits observed, not formally tested, Needs assistance Sitting-balance support: No upper extremity supported, Feet supported Sitting balance-Leahy Scale: Fair     Standing balance support: No upper extremity supported, During functional activity Standing balance-Leahy Scale: Fair Standing balance comment: statically                             Pertinent Vitals/Pain Pain Assessment Pain  Assessment: Faces Faces Pain Scale: Hurts even more Pain Location: Incision site Pain Descriptors /  Indicators: Operative site guarding, Sore Pain Intervention(s): Limited activity within patient's tolerance, Monitored during session, Repositioned    Home Living Family/patient expects to be discharged to:: Private residence Living Arrangements: Spouse/significant other Available Help at Discharge: Family;Available 24 hours/day Type of Home: House Home Access: Stairs to enter Entrance Stairs-Rails: None Entrance Stairs-Number of Steps: 2 Alternate Level Stairs-Number of Steps: flgiht Home Layout: Two level;Able to live on main level with bedroom/bathroom Home Equipment: Rolling Walker (2 wheels);Shower seat - built in;Toilet riser      Prior Function Prior Level of Function : Independent/Modified Independent                     Extremity/Trunk Assessment   Upper Extremity Assessment Upper Extremity Assessment: Defer to OT evaluation    Lower Extremity Assessment Lower Extremity Assessment: Generalized weakness (Mild; consistent with pre-op diagnosis)    Cervical / Trunk Assessment Cervical / Trunk Assessment: Back Surgery  Communication   Communication Communication: No apparent difficulties Factors Affecting Communication: Hearing impaired    Cognition Arousal: Alert Behavior During Therapy: WFL for tasks assessed/performed   PT - Cognitive impairments: No apparent impairments                         Following commands: Intact       Cueing Cueing Techniques: Verbal cues, Gestural cues     General Comments      Exercises     Assessment/Plan    PT Assessment Patient needs continued PT services  PT Problem List Decreased strength;Decreased activity tolerance;Decreased balance;Decreased mobility;Decreased safety awareness;Pain       PT Treatment Interventions DME instruction;Gait training;Stair training;Functional mobility training;Therapeutic activities;Therapeutic exercise;Balance training;Patient/family education    PT Goals (Current  goals can be found in the Care Plan section)  Acute Rehab PT Goals Patient Stated Goal: Home today PT Goal Formulation: With patient Time For Goal Achievement: 06/18/23 Potential to Achieve Goals: Good    Frequency Min 5X/week     Co-evaluation               AM-PAC PT "6 Clicks" Mobility  Outcome Measure Help needed turning from your back to your side while in a flat bed without using bedrails?: A Little Help needed moving from lying on your back to sitting on the side of a flat bed without using bedrails?: A Little Help needed moving to and from a bed to a chair (including a wheelchair)?: A Little Help needed standing up from a chair using your arms (e.g., wheelchair or bedside chair)?: A Little Help needed to walk in hospital room?: A Little Help needed climbing 3-5 steps with a railing? : A Little 6 Click Score: 18    End of Session Equipment Utilized During Treatment: Gait belt Activity Tolerance: Patient tolerated treatment well Patient left: in bed;with call bell/phone within reach Nurse Communication: Mobility status PT Visit Diagnosis: Unsteadiness on feet (R26.81);Pain Pain - part of body:  (back)    Time: 1610-9604 PT Time Calculation (min) (ACUTE ONLY): 31 min   Charges:   PT Evaluation $PT Eval Low Complexity: 1 Low PT Treatments $Gait Training: 8-22 mins PT General Charges $$ ACUTE PT VISIT: 1 Visit         Simone Dubois, PT, DPT Acute Rehabilitation Services Secure Chat Preferred Office: [phone]   Venus Ginsberg 06/11/2023, 11:16 AM

## 2023-06-11 NOTE — Progress Notes (Signed)

## 2023-06-11 NOTE — Discharge Summary (Signed)
 Physician Discharge Summary  Patient ID: Vanessa Dominguez MRN: [id number] DOB/AGE: May 25, 1945 78 y.o.  Admit date: 06/10/2023 Discharge date: 06/11/2023  Admission Diagnoses: Lumbar spinal stenosis, lumbar facet arthropathy, lumbar spinal stenosis, neurogenic claudication, lumbago, lumbar radiculopathy  Discharge Diagnoses: The same Principal Problem:   Spondylolisthesis, lumbar region   Discharged Condition: good  Hospital Course: I performed an L2-3, 3 4 and L4-5 decompression, instrumentation and fusion outpatient 06/10/2023.  The surgery went well.  The patient's postoperative course was unremarkable.  On postoperative day 1 she requested discharge home.  She was given verbal and written discharge instructions.  All her questions were answered.  Consults: PT, care management Significant Diagnostic Studies: None Treatments: L2-3, L3-4 and L4-5 decompression, instrumentation and fusion. Discharge Exam: Blood pressure (!) 102/54, pulse 86, temperature 98.4 F (36.9 C), temperature source Oral, resp. rate 16, height 5\' 7"  (1.702 m), weight 61.2 kg, SpO2 93%. The patient is alert and pleasant.  She looks well.  Her strength is grossly normal.  Disposition: Home  Discharge Instructions     Call MD for:  difficulty breathing, headache or visual disturbances   Complete by: As directed    Call MD for:  extreme fatigue   Complete by: As directed    Call MD for:  hives   Complete by: As directed    Call MD for:  persistant dizziness or light-headedness   Complete by: As directed    Call MD for:  persistant nausea and vomiting   Complete by: As directed    Call MD for:  redness, tenderness, or signs of infection (pain, swelling, redness, odor or green/yellow discharge around incision site)   Complete by: As directed    Call MD for:  severe uncontrolled pain   Complete by: As directed    Call MD for:  temperature >100.4   Complete by: As directed    Diet - low sodium heart healthy    Complete by: As directed    Discharge instructions   Complete by: As directed    Call [phone] for a followup appointment. Take a stool softener while you are using pain medications.   Driving Restrictions   Complete by: As directed    Do not drive for 2 weeks.   Increase activity slowly   Complete by: As directed    Lifting restrictions   Complete by: As directed    Do not lift more than 5 pounds. No excessive bending or twisting.   May shower / Bathe   Complete by: As directed    Remove the dressing for 3 days after surgery.  You may shower, but leave the incision alone.   Remove dressing in 48 hours   Complete by: As directed       Allergies as of 06/11/2023   No Known Allergies      Medication List     TAKE these medications    CALCIUM PO Take 1 tablet by mouth daily.   cyclobenzaprine 5 MG tablet Commonly known as: FLEXERIL Take 1 tablet (5 mg total) by mouth 3 (three) times daily as needed for muscle spasms.   docusate sodium 100 MG capsule Commonly known as: COLACE Take 1 capsule (100 mg total) by mouth 2 (two) times daily.   gabapentin  300 MG capsule Commonly known as: NEURONTIN  TAKE 1 CAPSULE 3 TIMES DAILY   lidocaine  5 % Commonly known as: Lidoderm  Place 1 patch onto the skin every 12 (twelve) hours. Remove & Discard patch within 12 hours  or as directed by MD   MAGNESIUM PO Take 1 tablet by mouth daily.   omeprazole  20 MG capsule Commonly known as: PRILOSEC Take 1 capsule (20 mg total) by mouth daily.   oxyCODONE-acetaminophen 5-325 MG tablet Commonly known as: PERCOCET/ROXICET Take 1-2 tablets by mouth every 4 (four) hours as needed for moderate pain (pain score 4-6).   vitamin E 180 MG (400 UNITS) capsule Take 400 Units by mouth daily.         Signed: [name] 06/11/2023, 7:43 AM

## 2023-06-13 ENCOUNTER — Emergency Department
Admission: EM | Admit: 2023-06-13 | Discharge: 2023-06-14 | Disposition: A | Discharge status: Home / Self Care | Attending: Emergency Medicine | Admitting: Emergency Medicine

## 2023-06-13 ENCOUNTER — Encounter (HOSPITAL_COMMUNITY): Payer: Self-pay | Admitting: Neurosurgery

## 2023-06-13 ENCOUNTER — Emergency Department

## 2023-06-13 ENCOUNTER — Other Ambulatory Visit: Payer: Self-pay

## 2023-06-13 DIAGNOSIS — D62 Acute posthemorrhagic anemia: Secondary | ICD-10-CM | POA: Diagnosis not present

## 2023-06-13 DIAGNOSIS — R531 Weakness: Secondary | ICD-10-CM | POA: Diagnosis not present

## 2023-06-13 DIAGNOSIS — R29898 Other symptoms and signs involving the musculoskeletal system: Secondary | ICD-10-CM

## 2023-06-13 DIAGNOSIS — T148XXA Other injury of unspecified body region, initial encounter: Secondary | ICD-10-CM

## 2023-06-13 DIAGNOSIS — G8918 Other acute postprocedural pain: Secondary | ICD-10-CM | POA: Diagnosis not present

## 2023-06-13 DIAGNOSIS — M96840 Postprocedural hematoma of a musculoskeletal structure following a musculoskeletal system procedure: Secondary | ICD-10-CM | POA: Diagnosis present

## 2023-06-13 LAB — CBC WITH DIFFERENTIAL/PLATELET
Abs Immature Granulocytes: 0.07 10*3/uL (ref 0.00–0.07)
Basophils Absolute: 0.1 10*3/uL (ref 0.0–0.1)
Basophils Relative: 1 %
Eosinophils Absolute: 0 10*3/uL (ref 0.0–0.5)
Eosinophils Relative: 0 %
HCT: 23.3 % — ABNORMAL LOW (ref 36.0–46.0)
Hemoglobin: 7.5 g/dL — ABNORMAL LOW (ref 12.0–15.0)
Immature Granulocytes: 1 %
Lymphocytes Relative: 8 %
Lymphs Abs: 0.8 10*3/uL (ref 0.7–4.0)
MCH: 30.2 pg (ref 26.0–34.0)
MCHC: 32.2 g/dL (ref 30.0–36.0)
MCV: 94 fL (ref 80.0–100.0)
Monocytes Absolute: 0.8 10*3/uL (ref 0.1–1.0)
Monocytes Relative: 9 %
Neutro Abs: 8.1 10*3/uL — ABNORMAL HIGH (ref 1.7–7.7)
Neutrophils Relative %: 81 %
Platelets: 258 10*3/uL (ref 150–400)
RBC: 2.48 MIL/uL — ABNORMAL LOW (ref 3.87–5.11)
RDW: 12.9 % (ref 11.5–15.5)
WBC: 9.9 10*3/uL (ref 4.0–10.5)
nRBC: 0 % (ref 0.0–0.2)

## 2023-06-13 LAB — COMPREHENSIVE METABOLIC PANEL WITH GFR
ALT: 14 U/L (ref 0–44)
AST: 23 U/L (ref 15–41)
Albumin: 3.4 g/dL — ABNORMAL LOW (ref 3.5–5.0)
Alkaline Phosphatase: 81 U/L (ref 38–126)
Anion gap: 11 (ref 5–15)
BUN: 12 mg/dL (ref 8–23)
CO2: 25 mmol/L (ref 22–32)
Calcium: 8.7 mg/dL — ABNORMAL LOW (ref 8.9–10.3)
Chloride: 100 mmol/L (ref 98–111)
Creatinine, Ser: 0.54 mg/dL (ref 0.44–1.00)
GFR, Estimated: >60 mL/min (ref >60)
Glucose, Bld: 111 mg/dL — ABNORMAL HIGH (ref 70–99)
Potassium: 3.7 mmol/L (ref 3.5–5.1)
Sodium: 136 mmol/L (ref 135–145)
Total Bilirubin: 0.9 mg/dL (ref 0.0–1.2)
Total Protein: 6.1 g/dL — ABNORMAL LOW (ref 6.5–8.1)

## 2023-06-13 MED ORDER — OXYCODONE HCL 5 MG PO TABS
5.0000 mg | ORAL_TABLET | Freq: Once | ORAL | Status: AC
  Administered 2023-06-13: 5 mg via ORAL
  Filled 2023-06-13: qty 1

## 2023-06-13 MED ORDER — GADOBUTROL 1 MMOL/ML IV SOLN
6.0000 mL | Freq: Once | INTRAVENOUS | Status: AC | PRN
  Administered 2023-06-13: 6 mL via INTRAVENOUS

## 2023-06-13 MED ORDER — SENNOSIDES-DOCUSATE SODIUM 8.6-50 MG PO TABS
1.0000 | ORAL_TABLET | Freq: Once | ORAL | Status: AC
  Administered 2023-06-13: 1 via ORAL
  Filled 2023-06-13: qty 1

## 2023-06-13 NOTE — ED Notes (Signed)
 [facility] Rep Called in with Neuro Provider @ 1015pm/ Consult w MD Karlynn Oyster

## 2023-06-13 NOTE — ED Notes (Signed)
@   2208 Called [facility] spoke with Ruby for consult to transfer- [facility]. [facility] will called back once neurosurgery is available.

## 2023-06-13 NOTE — ED Triage Notes (Signed)
 Patient states she had a lumbar fusion on Monday; hasn't been able to get out of chair or ambulate since yesterday morning. Called surgeon and was told to come to ED.

## 2023-06-13 NOTE — ED Notes (Signed)
 First nurse note- pt brought in via ems from home with back pain. Pt had back surgery this week.  Pt alert, in recliner in lobby   BP 157/78 P-109, o2 sats-97%

## 2023-06-13 NOTE — ED Notes (Signed)
Patient arrived back from MRI at this time.

## 2023-06-13 NOTE — ED Notes (Addendum)
 Patient took prescribed Flexeril 5 mg.

## 2023-06-13 NOTE — ED Provider Notes (Signed)
 ----------------------------------------- 11:13 PM on 06/13/2023 -----------------------------------------  Assuming care from Dr. Karlynn Oyster.  In short, Vanessa Dominguez is a 78 y.o. female with a chief complaint of back pain and weakness.  Refer to the original H&P for additional details.  The current plan of care is to follow up on MRI and reassess.  Anticipate need for admission given patient's 5 point drop in hemoglobin, generalized weakness, and intractable pain.  Thus far [facility] does not see the need for transfer to [facility], but that may change if there are post-operative complications seem on MRI.   Clinical Course as of 06/14/23 0217  Thu Jun 13, 2023  2218 Hemoglobin(!): 7.5 Spoke with neurosurgery at [facility] who performed her procedure.  They said an MRI of the lumbar spine is probably worthwhile to rule out any significant epidural hematomas which could be causing compression.  If there is no compression related to the surgery, likely just postoperative pain causing weakness.  At that point may need to be admitted for rehab and placement with pain control. [DW]  2226 Also stated that hemoglobin dropped to 7.5 can be expected with a large procedure like she had [DW]  Fri Jun 14, 2023  0055 MR Lumbar Spine W Wo Contrast I viewed and interpreted the patient's MRI lumbar spine and I can see extensive changes in what appears to be a fluid collection at the surgical site.  The radiologist called me to let me know about the critical and acute finding and concern for a postsurgical hematoma with severe canal stenosis which could explain the patient's symptoms.  He confirmed it is not possible to determine active extravasation based on the MRI.  I reassessed the patient.  She is able to lift both legs individually off the bed against gravity but her legs fall back down to the bed within 10 to 15 seconds.  She reports numbness in both of her legs but says she is not sure if it is worse than it  was before.  I assessed the wounds on her back and they are still covered with the postsurgical honeycomb dressing but there is no obvious swelling or wound dehiscence.  Patient reports pain is 9 out of 10 and I am ordering morphine  4 mg IV and Zofran  4 mg IV.  Given the acute drop in hemoglobin and the acute and potentially critical finding on MRI, I am calling back to [facility] neurosurgery to discuss and to inquire about the possibility of transfer back to [facility] to the patient's surgical team.  Patient understands the plan.  ED secretary placed in the call. [CF]  1610 Consulted Dr. Dinah Franco, hospitalist at [facility].  Discussed case, she agreed to admission, but unfortunately no Progressive beds are available.  I then consulted Dr. Carol Chroman in the ED who accepted the patient as an ED to ED transfer.  Awaiting [facility] transport. [CF]  0148 Of note, neurosurgery also requested Decadron  4 mg IV every 6 hours, and I provided the first dose.  Type and screen is pending.  I advised the patient to remain n.p.o. and I started normal saline 100 mL/h infusion which can be halted during transport if necessary [CF]  0217 I reassessed the patient immediately prior to EMS arrival (they are in the room now).  Patient is hemodynamically stable and her neuro exam is unchanged. [CF]    Clinical Course User Index [CF] Lynnda Sas, MD [DW] Kandee Orion, MD   .Critical Care  Performed by: Lynnda Sas,  MD Authorized by: Lynnda Sas, MD   Critical care provider statement:    Critical care time (minutes):  30   Critical care time was exclusive of:  Separately billable procedures and treating other patients   Critical care was necessary to treat or prevent imminent or life-threatening deterioration of the following conditions: post-operative neurosurgical complication.   Critical care was time spent personally by me on the following activities:  Development of treatment plan with patient or surrogate,  evaluation of patient's response to treatment, examination of patient, obtaining history from patient or surrogate, ordering and performing treatments and interventions, ordering and review of laboratory studies, ordering and review of radiographic studies, pulse oximetry, re-evaluation of patient's condition and review of old charts    Medications  dexamethasone  (DECADRON ) injection 4 mg (4 mg Intravenous Given 06/14/23 0212)  oxyCODONE  (Oxy IR/ROXICODONE ) immediate release tablet 5 mg (5 mg Oral Given 06/13/23 2210)  senna-docusate (Senokot-S) tablet 1 tablet (1 tablet Oral Given 06/13/23 2251)  gadobutrol  (GADAVIST ) 1 MMOL/ML injection 6 mL (6 mLs Intravenous Contrast Given 06/13/23 2320)  0.9 %  sodium chloride  infusion ( Intravenous New Bag/Given 06/14/23 0123)  morphine  (PF) 4 MG/ML injection 4 mg (4 mg Intravenous Given 06/14/23 0104)  ondansetron  (ZOFRAN ) injection 4 mg (4 mg Intravenous Given 06/14/23 0104)     ED Discharge Orders     None      Final diagnoses:  Post-operative pain  Weakness of both lower extremities  Hematoma  Acute blood loss anemia     Lynnda Sas, MD 06/14/23 [phone]

## 2023-06-13 NOTE — ED Provider Notes (Signed)
 [facility] Provider Note    Event Date/Time   First MD Initiated Contact with Patient 06/13/23 2122     (approximate)   History   Back Pain   HPI Vanessa Dominguez is a 78 y.o. female who underwent L2-L5 decompression with instrumentation and fusion on 06/10/2023 presenting today for low back pain and lower extremity weakness.  Patient had procedure performed 3 days ago and was able to be discharged the following day.  Reportedly was doing well for the first 2 days.  Over the past 24 hours Vanessa Dominguez has had worsening pain and weakness in her lower extremities.  Denies any numbness.  Unable to stand up which Vanessa Dominguez feels more is related to weakness in the legs rather than actual pain.  Vanessa Dominguez denies any significant blood loss from the site.  No blood in her stools.  No nausea or vomiting.  No abdominal pain.  Denies any loss of bowel or bladder.  No saddle anesthesia.     Physical Exam   Triage Vital Signs: ED Triage Vitals  Encounter Vitals Group     BP 06/13/23 1524 (!) 140/70     Systolic BP Percentile --      Diastolic BP Percentile --      Pulse Rate 06/13/23 1524 (!) 105     Resp 06/13/23 1524 20     Temp 06/13/23 1524 98.4 F (36.9 C)     Temp Source 06/13/23 1524 Oral     SpO2 06/13/23 1524 92 %     Weight 06/13/23 1525 135 lb (61.2 kg)     Height 06/13/23 1525 5\' 6"  (1.676 m)     Head Circumference --      Peak Flow --      Pain Score 06/13/23 1525 8     Pain Loc --      Pain Education --      Exclude from Growth Chart --     Most recent vital signs: Vitals:   06/13/23 1524 06/13/23 2221  BP: (!) 140/70   Pulse: (!) 105   Resp: 20   Temp: 98.4 F (36.9 C) 98.3 F (36.8 C)  SpO2: 92%    I have reviewed the vital signs. General:  Awake, alert, no acute distress. Head:  Normocephalic, Atraumatic. EENT:  PERRL, EOMI, Oral mucosa pink and moist, Neck is supple. Cardiovascular: Regular rate, 2+ distal pulses. Respiratory:  Normal respiratory  effort, symmetrical expansion, no distress.   Extremities:  Moving all four extremities through full ROM without pain.  Dressing intact to lumbar spine without any obvious significant bleeding from the site.  No obvious erythema or drainage elsewise.  Largely nontender around the area. Neuro:  Alert and oriented.  Interacting appropriately.  Sensation fully intact throughout bilateral lower extremities with no deficit.  Strength appears 5/5 throughout bilateral lower extremities with no significant weakness on one side or the other rather than just slight global weakness. Skin:  Warm, dry, no rash.   Psych: Appropriate affect.     ED Results / Procedures / Treatments   Labs (all labs ordered are listed, but only abnormal results are displayed) Labs Reviewed  COMPREHENSIVE METABOLIC PANEL WITH GFR - Abnormal; Notable for the following components:      Result Value   Glucose, Bld 111 (*)    Calcium 8.7 (*)    Total Protein 6.1 (*)    Albumin 3.4 (*)    All other components within normal limits  CBC  WITH DIFFERENTIAL/PLATELET - Abnormal; Notable for the following components:   RBC 2.48 (*)    Hemoglobin 7.5 (*)    HCT 23.3 (*)    Neutro Abs 8.1 (*)    All other components within normal limits     EKG    RADIOLOGY MRI pending at time of signout   PROCEDURES:  Critical Care performed: No  Procedures   MEDICATIONS ORDERED IN ED: Medications  senna-docusate (Senokot-S) tablet 1 tablet (has no administration in time range)  oxyCODONE (Oxy IR/ROXICODONE) immediate release tablet 5 mg (5 mg Oral Given 06/13/23 2210)     IMPRESSION / MDM / ASSESSMENT AND PLAN / ED COURSE  I reviewed the triage vital signs and the nursing notes.                              Differential diagnosis includes, but is not limited to, postoperative pain, postoperative complication such as seroma or hematoma with compression on the cord  Patient's presentation is most consistent with acute  complicated illness / injury requiring diagnostic workup.  Patient is a 78 year old female presenting today for low back pain and bilateral leg weakness following L2-L5 lumbar decompression and fusion 3 days ago.  Vanessa Dominguez seems to have global weakness in the lower extremities but not worse on one side and not significantly declined.  No numbness sensation at all.  Around the incisional site overall looks well with no significant bleeding or swelling.  No pus drainage.  No indication of infection based on her exam or laboratory workup.  Vanessa Dominguez does have a hemoglobin of 7.5 which is dropped from 12 at day of operation.  Reached out and talked with her neurosurgery team at [facility]  This hemoglobin drop can be expected with transient large procedure.  Vanessa Dominguez otherwise has no melanotic stools or hematochezia.  They are also unclear if it is pain causing the weakness or any postoperative complication.  They recommended getting an MRI just to be on the safe side of her lumbar spine to rule out any cord compression secondary to a seroma or hematoma.  MRI ordered.  Will be signed out to overnight provider pending results.  If negative, given that Vanessa Dominguez is unable to ambulate likely will need admission from a postoperative perspective for rehab.  The patient is on the cardiac monitor to evaluate for evidence of arrhythmia and/or significant heart rate changes. Clinical Course as of 06/13/23 2229  Thu Jun 13, 2023  2218 Hemoglobin(!): 7.5 Spoke with neurosurgery at [facility] who performed her procedure.  They said an MRI of the lumbar spine is probably worthwhile to rule out any significant epidural hematomas which could be causing compression.  If there is no compression related to the surgery, likely just postoperative pain causing weakness.  At that point may need to be admitted for rehab and placement with pain control. [DW]  2226 Also stated that hemoglobin dropped to 7.5 can be expected with a large procedure like Vanessa Dominguez  had [DW]    Clinical Course User Index [DW] Kandee Orion, MD     FINAL CLINICAL IMPRESSION(S) / ED DIAGNOSES   Final diagnoses:  Post-operative pain  Weakness of both lower extremities     Rx / DC Orders   ED Discharge Orders     None        Note:  This document was prepared using Dragon voice recognition software and may include unintentional dictation  errors.   Kandee Orion, MD 06/13/23 2230

## 2023-06-14 ENCOUNTER — Encounter (HOSPITAL_COMMUNITY): Payer: Self-pay

## 2023-06-14 ENCOUNTER — Emergency Department (HOSPITAL_COMMUNITY)

## 2023-06-14 ENCOUNTER — Inpatient Hospital Stay (HOSPITAL_COMMUNITY)
Admission: EM | Admit: 2023-06-14 | Discharge: 2023-06-18 | DRG: 908 | Disposition: A | Discharge status: Home Health | Attending: Neurosurgery | Admitting: Neurosurgery

## 2023-06-14 ENCOUNTER — Other Ambulatory Visit: Payer: Self-pay

## 2023-06-14 ENCOUNTER — Encounter (HOSPITAL_COMMUNITY)
Admission: EM | Disposition: A | Discharge status: Home Health | Payer: Self-pay | Source: Home / Self Care | Attending: Neurosurgery

## 2023-06-14 DIAGNOSIS — Z9851 Tubal ligation status: Secondary | ICD-10-CM

## 2023-06-14 DIAGNOSIS — Z8249 Family history of ischemic heart disease and other diseases of the circulatory system: Secondary | ICD-10-CM

## 2023-06-14 DIAGNOSIS — Z79899 Other long term (current) drug therapy: Secondary | ICD-10-CM

## 2023-06-14 DIAGNOSIS — S064X0A Epidural hemorrhage without loss of consciousness, initial encounter: Secondary | ICD-10-CM

## 2023-06-14 DIAGNOSIS — F17210 Nicotine dependence, cigarettes, uncomplicated: Secondary | ICD-10-CM | POA: Diagnosis present

## 2023-06-14 DIAGNOSIS — E7800 Pure hypercholesterolemia, unspecified: Secondary | ICD-10-CM | POA: Diagnosis not present

## 2023-06-14 DIAGNOSIS — Y838 Other surgical procedures as the cause of abnormal reaction of the patient, or of later complication, without mention of misadventure at the time of the procedure: Secondary | ICD-10-CM | POA: Diagnosis present

## 2023-06-14 DIAGNOSIS — Z9841 Cataract extraction status, right eye: Secondary | ICD-10-CM

## 2023-06-14 DIAGNOSIS — G834 Cauda equina syndrome: Secondary | ICD-10-CM | POA: Diagnosis present

## 2023-06-14 DIAGNOSIS — Z96652 Presence of left artificial knee joint: Secondary | ICD-10-CM | POA: Diagnosis present

## 2023-06-14 DIAGNOSIS — K219 Gastro-esophageal reflux disease without esophagitis: Secondary | ICD-10-CM | POA: Diagnosis present

## 2023-06-14 DIAGNOSIS — Z961 Presence of intraocular lens: Secondary | ICD-10-CM | POA: Diagnosis present

## 2023-06-14 DIAGNOSIS — M96840 Postprocedural hematoma of a musculoskeletal structure following a musculoskeletal system procedure: Secondary | ICD-10-CM | POA: Diagnosis not present

## 2023-06-14 DIAGNOSIS — Z833 Family history of diabetes mellitus: Secondary | ICD-10-CM

## 2023-06-14 DIAGNOSIS — S064XAA Epidural hemorrhage with loss of consciousness status unknown, initial encounter: Secondary | ICD-10-CM | POA: Diagnosis present

## 2023-06-14 DIAGNOSIS — G9762 Postprocedural hematoma of a nervous system organ or structure following other procedure: Principal | ICD-10-CM | POA: Diagnosis present

## 2023-06-14 DIAGNOSIS — Z981 Arthrodesis status: Secondary | ICD-10-CM

## 2023-06-14 DIAGNOSIS — Z9842 Cataract extraction status, left eye: Secondary | ICD-10-CM

## 2023-06-14 HISTORY — PX: LUMBAR LAMINECTOMY FOR EPIDURAL ABSCESS: SHX5956

## 2023-06-14 LAB — POCT I-STAT, CHEM 8
BUN: 10 mg/dL (ref 8–23)
Calcium, Ion: 1.13 mmol/L — ABNORMAL LOW (ref 1.15–1.40)
Chloride: 98 mmol/L (ref 98–111)
Creatinine, Ser: 0.6 mg/dL (ref 0.44–1.00)
Glucose, Bld: 124 mg/dL — ABNORMAL HIGH (ref 70–99)
HCT: 21 % — ABNORMAL LOW (ref 36.0–46.0)
Hemoglobin: 7.1 g/dL — ABNORMAL LOW (ref 12.0–15.0)
Potassium: 3.9 mmol/L (ref 3.5–5.1)
Sodium: 134 mmol/L — ABNORMAL LOW (ref 135–145)
TCO2: 23 mmol/L (ref 22–32)

## 2023-06-14 LAB — TYPE AND SCREEN
ABO/RH(D): O POS
Antibody Screen: NEGATIVE

## 2023-06-14 LAB — SURGICAL PCR SCREEN
MRSA, PCR: NEGATIVE
Staphylococcus aureus: POSITIVE — AB

## 2023-06-14 LAB — PREPARE RBC (CROSSMATCH)

## 2023-06-14 SURGERY — LUMBAR LAMINECTOMY FOR EPIDURAL ABSCESS
Anesthesia: General

## 2023-06-14 MED ORDER — LACTATED RINGERS IV SOLN: INTRAVENOUS | Status: DC

## 2023-06-14 MED ORDER — THROMBIN 5000 UNITS EX KIT
PACK | CUTANEOUS | Status: AC
  Filled 2023-06-14: qty 1

## 2023-06-14 MED ORDER — FENTANYL CITRATE (PF) 250 MCG/5ML IJ SOLN
INTRAMUSCULAR | Status: DC | PRN
  Administered 2023-06-14 (×3): 50 ug via INTRAVENOUS

## 2023-06-14 MED ORDER — ACETAMINOPHEN 325 MG PO TABS: 650.0000 mg | ORAL_TABLET | ORAL | Status: DC | PRN

## 2023-06-14 MED ORDER — ROCURONIUM BROMIDE 10 MG/ML (PF) SYRINGE
PREFILLED_SYRINGE | INTRAVENOUS | Status: DC | PRN
  Administered 2023-06-14: 10 mg via INTRAVENOUS
  Administered 2023-06-14: 50 mg via INTRAVENOUS

## 2023-06-14 MED ORDER — SUGAMMADEX SODIUM 200 MG/2ML IV SOLN
INTRAVENOUS | Status: DC | PRN
  Administered 2023-06-14: 200 mg via INTRAVENOUS

## 2023-06-14 MED ORDER — PHENOL 1.4 % MT LIQD: 1.0000 | OROMUCOSAL | Status: DC | PRN

## 2023-06-14 MED ORDER — SODIUM CHLORIDE 0.9 % IV SOLN: Freq: Once | INTRAVENOUS | Status: AC

## 2023-06-14 MED ORDER — FENTANYL CITRATE (PF) 250 MCG/5ML IJ SOLN
INTRAMUSCULAR | Status: AC
  Filled 2023-06-14: qty 5

## 2023-06-14 MED ORDER — ZOLPIDEM TARTRATE 5 MG PO TABS: 5.0000 mg | ORAL_TABLET | Freq: Every evening | ORAL | Status: DC | PRN

## 2023-06-14 MED ORDER — PROPOFOL 10 MG/ML IV BOLUS
INTRAVENOUS | Status: AC
  Filled 2023-06-14: qty 20

## 2023-06-14 MED ORDER — ONDANSETRON HCL 4 MG/2ML IJ SOLN: 4.0000 mg | Freq: Four times a day (QID) | INTRAMUSCULAR | Status: DC | PRN

## 2023-06-14 MED ORDER — FENTANYL CITRATE (PF) 100 MCG/2ML IJ SOLN
INTRAMUSCULAR | Status: AC
  Filled 2023-06-14: qty 2

## 2023-06-14 MED ORDER — PHENYLEPHRINE 80 MCG/ML (10ML) SYRINGE FOR IV PUSH (FOR BLOOD PRESSURE SUPPORT)
PREFILLED_SYRINGE | INTRAVENOUS | Status: AC
  Filled 2023-06-14: qty 10

## 2023-06-14 MED ORDER — ACETAMINOPHEN 650 MG RE SUPP: 650.0000 mg | RECTAL | Status: DC | PRN

## 2023-06-14 MED ORDER — CEFAZOLIN SODIUM-DEXTROSE 2-3 GM-%(50ML) IV SOLR
INTRAVENOUS | Status: DC | PRN
  Administered 2023-06-14: 2 g via INTRAVENOUS

## 2023-06-14 MED ORDER — SODIUM CHLORIDE 0.9% FLUSH: 3.0000 mL | INTRAVENOUS | Status: DC | PRN

## 2023-06-14 MED ORDER — SODIUM CHLORIDE 0.9 % IV SOLN: 250.0000 mL | INTRAVENOUS | Status: AC

## 2023-06-14 MED ORDER — THROMBIN 5000 UNITS EX SOLR
OROMUCOSAL | Status: DC | PRN
  Administered 2023-06-14: 5 mL via TOPICAL

## 2023-06-14 MED ORDER — ACETAMINOPHEN 500 MG PO TABS
1000.0000 mg | ORAL_TABLET | Freq: Four times a day (QID) | ORAL | Status: AC
  Administered 2023-06-14 – 2023-06-15 (×3): 1000 mg via ORAL
  Filled 2023-06-14 (×2): qty 2

## 2023-06-14 MED ORDER — ONDANSETRON HCL 4 MG/2ML IJ SOLN
INTRAMUSCULAR | Status: AC
  Filled 2023-06-14: qty 2

## 2023-06-14 MED ORDER — CHLORHEXIDINE GLUCONATE 0.12 % MT SOLN: 15.0000 mL | Freq: Once | OROMUCOSAL | Status: AC

## 2023-06-14 MED ORDER — PROPOFOL 10 MG/ML IV BOLUS
INTRAVENOUS | Status: DC | PRN
  Administered 2023-06-14: 100 mg via INTRAVENOUS

## 2023-06-14 MED ORDER — MENTHOL 3 MG MT LOZG: 1.0000 | LOZENGE | OROMUCOSAL | Status: DC | PRN

## 2023-06-14 MED ORDER — PHENYLEPHRINE 80 MCG/ML (10ML) SYRINGE FOR IV PUSH (FOR BLOOD PRESSURE SUPPORT)
PREFILLED_SYRINGE | INTRAVENOUS | Status: DC | PRN
  Administered 2023-06-14: 80 ug via INTRAVENOUS

## 2023-06-14 MED ORDER — SODIUM CHLORIDE 0.9 % IV SOLN
10.0000 mL/h | Freq: Once | INTRAVENOUS | Status: AC
  Administered 2023-06-14: 10 mL/h via INTRAVENOUS

## 2023-06-14 MED ORDER — OXYCODONE HCL 5 MG/5ML PO SOLN: 5.0000 mg | Freq: Once | ORAL | Status: DC | PRN

## 2023-06-14 MED ORDER — DEXAMETHASONE SODIUM PHOSPHATE 10 MG/ML IJ SOLN
INTRAMUSCULAR | Status: DC | PRN
  Administered 2023-06-14: 10 mg via INTRAVENOUS

## 2023-06-14 MED ORDER — ORAL CARE MOUTH RINSE: 15.0000 mL | Freq: Once | OROMUCOSAL | Status: AC

## 2023-06-14 MED ORDER — BISACODYL 10 MG RE SUPP: 10.0000 mg | Freq: Every day | RECTAL | Status: DC | PRN

## 2023-06-14 MED ORDER — FENTANYL CITRATE (PF) 100 MCG/2ML IJ SOLN
25.0000 ug | INTRAMUSCULAR | Status: DC | PRN
  Administered 2023-06-14 (×3): 50 ug via INTRAVENOUS

## 2023-06-14 MED ORDER — SODIUM CHLORIDE 0.9% FLUSH
3.0000 mL | Freq: Two times a day (BID) | INTRAVENOUS | Status: DC
  Administered 2023-06-14 – 2023-06-18 (×8): 3 mL via INTRAVENOUS

## 2023-06-14 MED ORDER — OXYCODONE HCL 5 MG PO TABS: 5.0000 mg | ORAL_TABLET | Freq: Once | ORAL | Status: DC | PRN

## 2023-06-14 MED ORDER — HYDROMORPHONE HCL 1 MG/ML IJ SOLN: 1.0000 mg | INTRAMUSCULAR | Status: DC | PRN

## 2023-06-14 MED ORDER — ROCURONIUM BROMIDE 10 MG/ML (PF) SYRINGE
PREFILLED_SYRINGE | INTRAVENOUS | Status: AC
  Filled 2023-06-14: qty 10

## 2023-06-14 MED ORDER — ALBUMIN HUMAN 5 % IV SOLN: INTRAVENOUS | Status: DC | PRN

## 2023-06-14 MED ORDER — BUPIVACAINE-EPINEPHRINE (PF) 0.5% -1:200000 IJ SOLN
INTRAMUSCULAR | Status: AC
  Filled 2023-06-14: qty 30

## 2023-06-14 MED ORDER — DOCUSATE SODIUM 100 MG PO CAPS
100.0000 mg | ORAL_CAPSULE | Freq: Two times a day (BID) | ORAL | Status: DC
  Administered 2023-06-14 – 2023-06-18 (×8): 100 mg via ORAL
  Filled 2023-06-14 (×8): qty 1

## 2023-06-14 MED ORDER — HYDROMORPHONE HCL 1 MG/ML IJ SOLN
INTRAMUSCULAR | Status: AC
  Filled 2023-06-14: qty 1

## 2023-06-14 MED ORDER — MUPIROCIN 2 % EX OINT
1.0000 | TOPICAL_OINTMENT | Freq: Two times a day (BID) | CUTANEOUS | Status: DC
  Administered 2023-06-14 – 2023-06-18 (×8): 1 via NASAL
  Filled 2023-06-14 (×3): qty 22

## 2023-06-14 MED ORDER — DEXAMETHASONE SODIUM PHOSPHATE 10 MG/ML IJ SOLN
INTRAMUSCULAR | Status: AC
  Filled 2023-06-14: qty 1

## 2023-06-14 MED ORDER — KETAMINE HCL 50 MG/5ML IJ SOSY
PREFILLED_SYRINGE | INTRAMUSCULAR | Status: DC | PRN
  Administered 2023-06-14 (×5): 10 mg via INTRAVENOUS

## 2023-06-14 MED ORDER — OXYCODONE HCL 5 MG PO TABS
10.0000 mg | ORAL_TABLET | ORAL | Status: DC | PRN
  Administered 2023-06-15 – 2023-06-17 (×6): 10 mg via ORAL
  Filled 2023-06-14 (×6): qty 2

## 2023-06-14 MED ORDER — GABAPENTIN 100 MG PO CAPS
100.0000 mg | ORAL_CAPSULE | Freq: Three times a day (TID) | ORAL | Status: DC
  Administered 2023-06-14 – 2023-06-18 (×12): 100 mg via ORAL
  Filled 2023-06-14 (×12): qty 1

## 2023-06-14 MED ORDER — LIDOCAINE 2% (20 MG/ML) 5 ML SYRINGE
INTRAMUSCULAR | Status: DC | PRN
  Administered 2023-06-14: 50 mg via INTRAVENOUS

## 2023-06-14 MED ORDER — VASHE WOUND IRRIGATION OPTIME
TOPICAL | Status: DC | PRN
  Administered 2023-06-14: 34 [oz_av]

## 2023-06-14 MED ORDER — DEXAMETHASONE SODIUM PHOSPHATE 4 MG/ML IJ SOLN
4.0000 mg | Freq: Four times a day (QID) | INTRAMUSCULAR | Status: DC
  Administered 2023-06-14: 4 mg via INTRAVENOUS
  Filled 2023-06-14: qty 1

## 2023-06-14 MED ORDER — CHLORHEXIDINE GLUCONATE 0.12 % MT SOLN
OROMUCOSAL | Status: AC
Start: 2023-06-14 — End: 2023-06-14
  Administered 2023-06-14: 15 mL via OROMUCOSAL
  Filled 2023-06-14: qty 15

## 2023-06-14 MED ORDER — CYCLOBENZAPRINE HCL 10 MG PO TABS
10.0000 mg | ORAL_TABLET | Freq: Three times a day (TID) | ORAL | Status: DC | PRN
  Administered 2023-06-14 – 2023-06-17 (×5): 10 mg via ORAL
  Filled 2023-06-14 (×5): qty 1

## 2023-06-14 MED ORDER — CEFAZOLIN SODIUM-DEXTROSE 2-4 GM/100ML-% IV SOLN
2.0000 g | Freq: Three times a day (TID) | INTRAVENOUS | Status: AC
  Administered 2023-06-14 – 2023-06-15 (×2): 2 g via INTRAVENOUS
  Filled 2023-06-14 (×2): qty 100

## 2023-06-14 MED ORDER — PANTOPRAZOLE SODIUM 40 MG PO TBEC
40.0000 mg | DELAYED_RELEASE_TABLET | Freq: Every day | ORAL | Status: DC
  Administered 2023-06-14 – 2023-06-18 (×5): 40 mg via ORAL
  Filled 2023-06-14 (×5): qty 1

## 2023-06-14 MED ORDER — 0.9 % SODIUM CHLORIDE (POUR BTL) OPTIME
TOPICAL | Status: DC | PRN
  Administered 2023-06-14: 1000 mL

## 2023-06-14 MED ORDER — OXYCODONE HCL 5 MG PO TABS
5.0000 mg | ORAL_TABLET | ORAL | Status: DC | PRN
  Administered 2023-06-14 – 2023-06-15 (×3): 5 mg via ORAL
  Filled 2023-06-14 (×3): qty 1

## 2023-06-14 MED ORDER — HYDROMORPHONE HCL 1 MG/ML IJ SOLN
0.2500 mg | INTRAMUSCULAR | Status: DC | PRN
  Administered 2023-06-14: 0.5 mg via INTRAVENOUS

## 2023-06-14 MED ORDER — ONDANSETRON HCL 4 MG/2ML IJ SOLN
4.0000 mg | INTRAMUSCULAR | Status: AC
  Administered 2023-06-14: 4 mg via INTRAVENOUS
  Filled 2023-06-14: qty 2

## 2023-06-14 MED ORDER — BACITRACIN ZINC 500 UNIT/GM EX OINT
TOPICAL_OINTMENT | CUTANEOUS | Status: AC
  Filled 2023-06-14: qty 28.35

## 2023-06-14 MED ORDER — CHLORHEXIDINE GLUCONATE CLOTH 2 % EX PADS
6.0000 | MEDICATED_PAD | Freq: Every day | CUTANEOUS | Status: DC
  Administered 2023-06-14 – 2023-06-15 (×2): 6 via TOPICAL

## 2023-06-14 MED ORDER — MORPHINE SULFATE (PF) 4 MG/ML IV SOLN
4.0000 mg | Freq: Once | INTRAVENOUS | Status: AC
  Administered 2023-06-14: 4 mg via INTRAVENOUS
  Filled 2023-06-14: qty 1

## 2023-06-14 MED ORDER — MORPHINE SULFATE (PF) 2 MG/ML IV SOLN: 2.0000 mg | INTRAVENOUS | Status: DC | PRN

## 2023-06-14 SURGICAL SUPPLY — 40 items
BAG COUNTER SPONGE SURGICOUNT (BAG) ×1 IMPLANT
BENZOIN TINCTURE PRP APPL 2/3 (GAUZE/BANDAGES/DRESSINGS) ×1 IMPLANT
BLADE CLIPPER SURG (BLADE) IMPLANT
BUR MATCHSTICK NEURO 3.0 LAGG (BURR) IMPLANT
BUR PRECISION FLUTE 6.0 (BURR) IMPLANT
CANISTER SUCTION 3000ML PPV (SUCTIONS) ×1 IMPLANT
CLEANSER WND VASHE INSTL 34OZ (WOUND CARE) IMPLANT
CLSR STERI-STRIP ANTIMIC 1/2X4 (GAUZE/BANDAGES/DRESSINGS) IMPLANT
DRAPE LAPAROTOMY 100X72X124 (DRAPES) ×1 IMPLANT
DRAPE SURG 17X23 STRL (DRAPES) ×4 IMPLANT
DRSG OPSITE POSTOP 4X8 (GAUZE/BANDAGES/DRESSINGS) IMPLANT
ELECTRODE REM PT RTRN 9FT ADLT (ELECTROSURGICAL) ×1 IMPLANT
EVACUATOR 1/8 PVC DRAIN (DRAIN) IMPLANT
GAUZE 4X4 16PLY ~~LOC~~+RFID DBL (SPONGE) IMPLANT
GAUZE SPONGE 4X4 12PLY STRL (GAUZE/BANDAGES/DRESSINGS) ×1 IMPLANT
GLOVE BIO SURGEON STRL SZ8 (GLOVE) ×1 IMPLANT
GLOVE BIO SURGEON STRL SZ8.5 (GLOVE) ×1 IMPLANT
GLOVE EXAM NITRILE XL STR (GLOVE) IMPLANT
GOWN STRL REUS W/ TWL LRG LVL3 (GOWN DISPOSABLE) ×1 IMPLANT
GOWN STRL REUS W/ TWL XL LVL3 (GOWN DISPOSABLE) ×1 IMPLANT
HEMOSTAT POWDER KIT SURGIFOAM (HEMOSTASIS) ×1 IMPLANT
KIT BASIN OR (CUSTOM PROCEDURE TRAY) ×1 IMPLANT
KIT TURNOVER KIT B (KITS) ×1 IMPLANT
NDL HYPO 22X1.5 SAFETY MO (MISCELLANEOUS) IMPLANT
NEEDLE HYPO 22X1.5 SAFETY MO (MISCELLANEOUS) IMPLANT
NS IRRIG 1000ML POUR BTL (IV SOLUTION) ×1 IMPLANT
PACK LAMINECTOMY NEURO (CUSTOM PROCEDURE TRAY) ×1 IMPLANT
PAD ARMBOARD POSITIONER FOAM (MISCELLANEOUS) ×3 IMPLANT
PATTIES SURGICAL .5 X.5 (GAUZE/BANDAGES/DRESSINGS) ×1 IMPLANT
PATTIES SURGICAL 1X1 (DISPOSABLE) ×1 IMPLANT
STRIP CLOSURE SKIN 1/2X4 (GAUZE/BANDAGES/DRESSINGS) ×1 IMPLANT
SUT ETHILON 2 0 PSLX (SUTURE) IMPLANT
SUT VIC AB 1 CT1 18XBRD ANBCTR (SUTURE) ×1 IMPLANT
SUT VIC AB 2-0 CP2 18 (SUTURE) ×1 IMPLANT
SWAB COLLECTION DEVICE MRSA (MISCELLANEOUS) IMPLANT
SWAB CULTURE ESWAB REG 1ML (MISCELLANEOUS) IMPLANT
SYR 30ML SLIP (SYRINGE) ×1 IMPLANT
TOWEL GREEN STERILE (TOWEL DISPOSABLE) ×1 IMPLANT
TOWEL GREEN STERILE FF (TOWEL DISPOSABLE) ×1 IMPLANT
WATER STERILE IRR 1000ML POUR (IV SOLUTION) ×1 IMPLANT

## 2023-06-14 NOTE — ED Triage Notes (Signed)
 [facility] transfer from [facility]. Patient had a lumbar fusion on Monday and has had decreased ability to ambulate since Thursday.  Currebntly experiencing weakness in bilateral lower extremities.  VS 134/61 BP 98.65F Temp 90 HR 18 RR 100% 2L

## 2023-06-14 NOTE — ED Notes (Signed)
@   0102 called [facility] spoke with Kionna; request to speak with Neurosurgery

## 2023-06-14 NOTE — H&P (Signed)
 Subjective: The patient is a 78 year old white female on whom I performed an L2-3, L3-4 and L4-5 fusion on 06/10/2023.  The patient did great after surgery and was discharged home with postoperative day #1.  The patient, and her husband, tells me that she began having weakness in her legs on Wednesday.  She had difficulty walking.  Her husband called EMS and they were taken to [facility] where an MRI scan was obtained.  Dr. Andy Bannister was contacted and recommended the patient be transferred to [facility].  He called me this morning and I immediately posted the patient for an emergency evacuation of the hematoma and came to the ER to evaluate the patient.  The patient complains of weakness to her lower extremities and some numbness.  She has had incontinence of urine.  She is constipated.  Past Medical History:  Diagnosis Date   Arthritis    GERD (gastroesophageal reflux disease)    Left bundle branch block    Chronic   Pneumonia     Past Surgical History:  Procedure Laterality Date   CATARACT EXTRACTION W/PHACO Left 12/13/2015   Procedure: CATARACT EXTRACTION PHACO AND INTRAOCULAR LENS PLACEMENT (IOC);  Surgeon: Clair Crews, MD;  Location: [facility] ORS;  Service: Ophthalmology;  Laterality: Left;  Lot# [id number] H US :00:36.0 AP%: 23.7 CDE: 8.52   CATARACT EXTRACTION W/PHACO Right 01/10/2016   Procedure: CATARACT EXTRACTION PHACO AND INTRAOCULAR LENS PLACEMENT (IOC);  Surgeon: Clair Crews, MD;  Location: [facility] ORS;  Service: Ophthalmology;  Laterality: Right;  Lot # [id number] H US : 00:38.4 AP%: 21.3 CDE: 8.17   CESAREAN SECTION     EYE SURGERY     JOINT REPLACEMENT Left 02/06/2004   Lt knee   KNEE SURGERY Left [id number]   LUMBAR FUSION     TUBAL LIGATION  02/06/1987    No Known Allergies  Social History   Tobacco Use   Smoking status: Former    Current packs/day: 0.00    Average packs/day: 0.1 packs/day for 54.6 years (5.5 ttl pk-yrs)    Types: Cigarettes    Start date:  58    Quit date: 09/2022    Years since quitting: 0.7   Smokeless tobacco: Never   Tobacco comments:    05/08/23 2-3 cig/day when smoking  Substance Use Topics   Alcohol use: No    Family History  Problem Relation Age of Onset   Diabetes Mother    Cancer Father    Heart attack Brother        10/2016   Breast cancer Neg Hx    Prior to Admission medications   Medication Sig Start Date End Date Taking? Authorizing Provider  promethazine-dextromethorphan (PROMETHAZINE-DM) 6.25-15 MG/5ML syrup Take by mouth. 05/13/23  Yes [provider]  CALCIUM PO Take 1 tablet by mouth daily.    [provider]  cyclobenzaprine  (FLEXERIL ) 5 MG tablet Take 1 tablet (5 mg total) by mouth 3 (three) times daily as needed for muscle spasms. 06/11/23   Garry Kansas, MD  docusate sodium  (COLACE) 100 MG capsule Take 1 capsule (100 mg total) by mouth 2 (two) times daily. 06/11/23   Garry Kansas, MD  gabapentin  (NEURONTIN ) 300 MG capsule TAKE 1 CAPSULE 3 TIMES DAILY 07/28/20   Mazie Speed, MD  lidocaine  (LIDODERM ) 5 % Place 1 patch onto the skin every 12 (twelve) hours. Remove & Discard patch within 12 hours or as directed by MD Patient not taking: Reported on 07/17/2022 06/08/22   Jacquie Maudlin, MD  MAGNESIUM PO Take  1 tablet by mouth daily.    [provider]  omeprazole  (PRILOSEC) 20 MG capsule Take 1 capsule (20 mg total) by mouth daily. Patient not taking: Reported on 05/06/2023 04/28/21   Trenton Frock, [name]  oxyCODONE -acetaminophen  (PERCOCET/ROXICET) 5-325 MG tablet Take 1-2 tablets by mouth every 4 (four) hours as needed for moderate pain (pain score 4-6). 06/11/23   Garry Kansas, MD  vitamin E 180 MG (400 UNITS) capsule Take 400 Units by mouth daily.    [provider]     Review of Systems  Positive ROS: As above  All other systems have been reviewed and were otherwise negative with the exception of those mentioned in the HPI and as  above.  Objective: Vital signs in last 24 hours: Temp:  [98.1 F (36.7 C)-98.4 F (36.9 C)] 98.1 F (36.7 C) (05/09 0308) Pulse Rate:  [89-110] 99 (05/09 0645) Resp:  [14-21] 18 (05/09 0645) BP: (124-151)/(61-108) 131/77 (05/09 0645) SpO2:  [92 %-100 %] 100 % (05/09 0645) Weight:  [61.2 kg] 61.2 kg (05/08 1525) Estimated body mass index is 21.79 kg/m as calculated from the following:   Height as of 06/13/23: 5\' 6"  (1.676 m).   Weight as of 06/13/23: 61.2 kg.   General Appearance: Alert Head: Normocephalic, without obvious abnormality, atraumatic Eyes: PERRL, conjunctiva/corneas clear, EOM's intact,    Ears: Normal  Throat: Normal  Neck: Supple, Back: The patient's honeycomb dressing is still in place.  It is clean and dry. Lungs: Clear to auscultation bilaterally, respirations unlabored Heart: Regular rate and rhythm, no murmur, rub or gallop Abdomen: Soft, non-tender Extremities: Extremities normal, atraumatic, no cyanosis or edema Skin: unremarkable  NEUROLOGIC:   Mental status: alert and oriented,Motor Exam -the patient's motor strength is 4 to 4+/5 in her bilateral iliopsoas, quadricep, gastrocnemius and dorsiflexors. Sensory Exam - grossly normal Reflexes:  Coordination - grossly normal Gait -she is on the stretcher. Balance -not tested Cranial Nerves: I: smell Not tested  II: visual acuity  OS: Normal  OD: Normal   II: visual fields Full to confrontation  II: pupils Equal, round, reactive to light  III,VII: ptosis None  III,IV,VI: extraocular muscles  Full ROM  V: mastication Normal  V: facial light touch sensation  Normal  V,VII: corneal reflex  Present  VII: facial muscle function - upper  Normal  VII: facial muscle function - lower Normal  VIII: hearing Not tested  IX: soft palate elevation  Normal  IX,X: gag reflex Present  XI: trapezius strength  5/5  XI: sternocleidomastoid strength 5/5  XI: neck flexion strength  5/5  XII: tongue strength  Normal     Data Review Lab Results  Component Value Date   WBC 9.9 06/13/2023   HGB 7.5 (L) 06/13/2023   HCT 23.3 (L) 06/13/2023   MCV 94.0 06/13/2023   PLT 258 06/13/2023   Lab Results  Component Value Date   NA 136 06/13/2023   K 3.7 06/13/2023   CL 100 06/13/2023   CO2 25 06/13/2023   BUN 12 06/13/2023   CREATININE 0.54 06/13/2023   GLUCOSE 111 (H) 06/13/2023   No results found for: "INR", "PROTIME"  I reviewed the patient's lumbar MRI performed this morning at [facility].  She appears to have a moderate epidural hematoma with mass effect mainly at L2-3.  Assessment/Plan: Lumbar epidural hematoma: I discussed situation with the patient and her husband.  We discussed the various treatment options.  I recommended an evacuation of the epidural hematoma.  I described  the procedure and the risks including risk anesthesia, infection, recurrent hematoma, etc.  We discussed the alternatives including observation, etc.  We also discussed the expected postoperative course and likelihood of achieving her goals with surgery.  I have answered all the questions.  She has decided proceed with surgery.   [name] 06/14/2023 7:48 AM

## 2023-06-14 NOTE — Progress Notes (Signed)
 Orthopedic Tech Progress Note Patient Details:  Vanessa Dominguez 1945-07-24 [id number]  Spoke with patient while she was in recovery and she stated " has has her back brace"  Patient ID: Vanessa Dominguez, female   DOB: 01/27/1946, 78 y.o.   MRN: [id number]  Vanessa Dominguez 06/14/2023, 5:04 PM

## 2023-06-14 NOTE — Anesthesia Procedure Notes (Cosign Needed Addendum)
 Procedure Name: Intubation Date/Time: 06/14/2023 8:37 AM  Performed by: Jonah Negus, RNPre-anesthesia Checklist: Patient identified, Emergency Drugs available, Suction available and Patient being monitored Patient Re-evaluated:Patient Re-evaluated prior to induction Oxygen Delivery Method: Circle system utilized Preoxygenation: Pre-oxygenation with 100% oxygen Induction Type: IV induction Ventilation: Mask ventilation without difficulty Laryngoscope Size: Mac and 3 Grade View: Grade I Tube type: Oral Tube size: 7.0 mm Number of attempts: 1 Airway Equipment and Method: Stylet and Oral airway Placement Confirmation: ETT inserted through vocal cords under direct vision, positive ETCO2 and breath sounds checked- equal and bilateral Secured at: 21 cm Tube secured with: Tape Dental Injury: Teeth and Oropharynx as per pre-operative assessment

## 2023-06-14 NOTE — ED Notes (Signed)
 No beds available at [facility], so Dr. Carol Chroman accepted the patient for ED to ED transfer. [facility] will call back with contact info to call report. They are sending transport.

## 2023-06-14 NOTE — Op Note (Signed)
 Brief history: The patient is a 78 year old white female on whom I performed an L2-3, L3-4 and L4-5 decompression, instrumentation and fusion on 06/09/2023.  The surgery went well and she was discharged home on postoperative day #1.  The patient returned to the [facility] ER last night with lower extremity weakness and difficulty with ambulation and some urinary incontinence.  She was worked up with a lumbar MRI which demonstrated findings consistent with an epidural hematoma.  She was transferred to [facility].  I discussed the situation with the patient and recommended emergent surgery.  She is decided proceed with evacuation of the hematoma.  Pre-Op diagnosis: Lumbar epidural hematoma  Postop diagnosis: The same  Procedure: Evacuation of epidural hematoma, right L1-2 laminotomy  Surgeon: Dr. Pleasant Brilliant  Assistant: None  Anesthesia: General Tracheal  Estimated blood loss: Minimal  Specimens: None  Drains: 2 medium Hemovac drains in the epidural space  Complications: None  Description of procedure: The patient was brought to the operating room by the anesthesia team.  General endotracheal anesthesia was induced.  She was turned to the prone position on the [place] table.  Her Steri-Strips were removed and her lumbar region was then prepared with Betadine  scrub and Betadine  solution.  Sterile drapes were applied.  I then used the scalpel to incise through the patient's fresh surgical scar.  I encountered some subcutaneous liquefied hematoma/seroma which I removed with suction.  I dissected deeper and incised the fascial sutures with a scalpel.  I used the cerebellar retractor for exposure.  There was large liquefied epidural hematoma/seroma under moderate pressure which was easily removed with suction.  I did not see any active bleeding points.  The thecal sac at L2-3, L3-4 and L4-5 were well decompressed bilaterally.  Because of the MRI findings I used the high-speed drill to perform  a right L1-2 laminotomy.  I removed the ligamentum flavum.  There was a small amount of epidural hematoma in the epidural space which I removed with suction.  I then obtained hemostasis using bipolar cautery.  I irrigated the wound out with Vashe solution.  I then removed the retractors.  I placed a medium Hemovac drain in the epidural space bilaterally and tunneled the drains out through separate stab wounds.  I then reapproximated the patient's thoracolumbar fascia with interrupted #1 Vicryl suture.  I reapproximated subcutaneous tissue with interrupted 2-0 Vicryl suture.  I reapproximated the skin with Steri-Strips and benzoin.  The wound was then coated with bacitracin  ointment.  Sterile resting's were applied.  The drapes were removed.  By report all sponge, instrument, and needle counts were correct at the end of this case.

## 2023-06-14 NOTE — Transfer of Care (Signed)
 Immediate Anesthesia Transfer of Care Note  Patient: Vanessa Dominguez  Procedure(s) Performed: EVACUATION LUMBAR EPIDURAL HEMATOMA  Patient Location: PACU  Anesthesia Type:General  Level of Consciousness: awake and patient cooperative  Airway & Oxygen Therapy: Patient Spontanous Breathing and Patient connected to face mask oxygen  Post-op Assessment: Report given to RN and Post -op Vital signs reviewed and stable  Post vital signs: Reviewed and stable  Last Vitals:  Vitals Value Taken Time  BP 157/81 06/14/23 1000  Temp 36.7 C 06/14/23 1000  Pulse 97 06/14/23 1004  Resp 19 06/14/23 1004  SpO2 100 % 06/14/23 1004  Vitals shown include unfiled device data.  Last Pain:  Vitals:   06/14/23 1000  TempSrc: Axillary  PainSc:          Complications: There were no known notable events for this encounter.

## 2023-06-14 NOTE — ED Provider Notes (Signed)
 [place] EMERGENCY DEPARTMENT AT [facility] Provider Note   CSN: [id number] Arrival date & time: 06/14/23  0302     History  Chief Complaint  Patient presents with   Back Pain    Vanessa Dominguez is a 78 y.o. female.  Patient transferred from [facility] ED for further evaluation.  Patient had lumbar fusion here at [facility] 4 days ago.  Since the surgery she has had increasing pain and decreased ability to ambulate secondary to lower extremity weakness.  Patient underwent imaging at [facility] regional that shows large (T11-sacral) canal hematoma.  Patient reports that she received pain medication before leaving [facility] and is currently comfortable.  No change in her sensory or motor function.       Home Medications Prior to Admission medications   Medication Sig Start Date End Date Taking? Authorizing Provider  CALCIUM PO Take 1 tablet by mouth daily.    [provider]  cyclobenzaprine  (FLEXERIL ) 5 MG tablet Take 1 tablet (5 mg total) by mouth 3 (three) times daily as needed for muscle spasms. 06/11/23   Garry Kansas, MD  docusate sodium  (COLACE) 100 MG capsule Take 1 capsule (100 mg total) by mouth 2 (two) times daily. 06/11/23   Garry Kansas, MD  gabapentin  (NEURONTIN ) 300 MG capsule TAKE 1 CAPSULE 3 TIMES DAILY 07/28/20   Mazie Speed, MD  lidocaine  (LIDODERM ) 5 % Place 1 patch onto the skin every 12 (twelve) hours. Remove & Discard patch within 12 hours or as directed by MD Patient not taking: Reported on 07/17/2022 06/08/22   Jacquie Maudlin, MD  MAGNESIUM PO Take 1 tablet by mouth daily.    [provider]  omeprazole  (PRILOSEC) 20 MG capsule Take 1 capsule (20 mg total) by mouth daily. Patient not taking: Reported on 05/06/2023 04/28/21   Trenton Frock, [name]  oxyCODONE -acetaminophen  (PERCOCET/ROXICET) 5-325 MG tablet Take 1-2 tablets by mouth every 4 (four) hours as needed for moderate pain (pain score  4-6). 06/11/23   Garry Kansas, MD  vitamin E 180 MG (400 UNITS) capsule Take 400 Units by mouth daily.    [provider]      Allergies    Patient has no known allergies.    Review of Systems   Review of Systems  Physical Exam Updated Vital Signs BP 134/67   Pulse 98   Temp 98.1 F (36.7 C) (Oral)   Resp 14   SpO2 100%  Physical Exam Vitals and nursing note reviewed.  Constitutional:      Appearance: Normal appearance.  HENT:     Head: Normocephalic.  Eyes:     Pupils: Pupils are equal, round, and reactive to light.  Cardiovascular:     Rate and Rhythm: Normal rate and regular rhythm.  Pulmonary:     Effort: Pulmonary effort is normal.     Breath sounds: Normal breath sounds.  Abdominal:     Palpations: Abdomen is soft.  Skin:    General: Skin is warm.  Neurological:     General: No focal deficit present.     Mental Status: She is alert.  Psychiatric:        Mood and Affect: Mood normal.     ED Results / Procedures / Treatments   Labs (all labs ordered are listed, but only abnormal results are displayed) Labs Reviewed - No data to display  EKG None  Radiology MR Lumbar Spine W Wo Contrast Result Date: 06/14/2023 CLINICAL DATA:  Recent L2-L5 decompression and fusion 3 days ago presenting with new onset bilateral lower extremity weakness. Evaluating for possible hematoma/seroma with cord compression posterior epidural fluid EXAM: MRI LUMBAR SPINE WITHOUT AND WITH CONTRAST TECHNIQUE: Multiplanar and multiecho pulse sequences of the lumbar spine were obtained without and with intravenous contrast. CONTRAST:  6mL GADAVIST  GADOBUTROL  1 MMOL/ML IV SOLN COMPARISON:  MRI March 22, 2023. FINDINGS: Segmentation: Standard segmentation is assumed. Alignment:  No substantial sagittal subluxation. Vertebrae: Interval L2-L5 PLIF. Associated edema is most likely postoperative but nonspecific. Conus medullaris and cauda equina: Conus extends to the L1-L2 level. The  conus is anteriorly displaced by the fluid collection ascribed below. Paraspinal and other soft tissues: Postoperative changes in the posterior paraspinal soft tissues. Disc levels: Lobulated collection which extends from approximately T11-T12 inferiorly throughout the lumbar canal into the sacral canal. The collection measures up to 8 mm in thickness at L2-L3 and is circumferential at this level. Resulting severe canal stenosis at multiple levels, greatest at L1-L2 to L4-L5. Moderate canal stenosis at T12-L1. IMPRESSION: Interval L2-L5 PLIF. New large lobulated collection within the canal which extends from T11-T12 inferiorly throughout the lumbar and sacral canal, compatible with hematoma given the clinical history. The collection measures up to 8 mm in thickness at L2-L3 with severe canal stenosis at L1-L2 to L4-L5. Moderate canal stenosis at T12-L1. Findings discussed with Dr. Genette Kent via telephone at 12:26 AM. Electronically Signed   By: Stevenson Elbe M.D.   On: 06/14/2023 00:28    Procedures Procedures    Medications Ordered in ED Medications - No data to display  ED Course/ Medical Decision Making/ A&P                                 Medical Decision Making  Presented as a transfer from [facility] for neurosurgical evaluation.  Patient underwent surgery 4 days ago and has a postoperative hematoma causing mass effect.  Upon arrival here to the ED, patient has good strength distally with normal sensation.  Neurosurgery updated, will hold patient in the ED until morning for neurosurgical evaluation and admission.        Final Clinical Impression(s) / ED Diagnoses Final diagnoses:  Postprocedural hematoma of a nervous system organ or structure following other procedure    Rx / DC Orders ED Discharge Orders     None         Shelitha Magley, Marine Sia, MD 06/14/23 0410

## 2023-06-14 NOTE — Anesthesia Preprocedure Evaluation (Signed)
 Anesthesia Evaluation  Patient identified by MRN, date of birth, ID band Patient awake    Reviewed: Allergy & Precautions, H&P , NPO status , Patient's Chart, lab work & pertinent test results  Airway Mallampati: II   Neck ROM: full    Dental   Pulmonary former smoker   breath sounds clear to auscultation       Cardiovascular + dysrhythmias  Rhythm:regular Rate:Normal  LBBB   Neuro/Psych S/p 3 level lumbar fusion 06/10/23.  Now has lower extremity weakness and urinary incontinence.    GI/Hepatic ,GERD  ,,  Endo/Other    Renal/GU      Musculoskeletal  (+) Arthritis ,    Abdominal   Peds  Hematology   Anesthesia Other Findings   Reproductive/Obstetrics                             Anesthesia Physical Anesthesia Plan  ASA: 3 and emergent  Anesthesia Plan: General   Post-op Pain Management:    Induction: Intravenous  PONV Risk Score and Plan: 3 and Ondansetron , Dexamethasone , Midazolam  and Treatment may vary due to age or medical condition  Airway Management Planned: Oral ETT  Additional Equipment:   Intra-op Plan:   Post-operative Plan: Extubation in OR  Informed Consent: I have reviewed the patients History and Physical, chart, labs and discussed the procedure including the risks, benefits and alternatives for the proposed anesthesia with the patient or authorized representative who has indicated his/her understanding and acceptance.     Dental advisory given  Plan Discussed with: CRNA, Anesthesiologist and Surgeon  Anesthesia Plan Comments:        Anesthesia Quick Evaluation

## 2023-06-14 NOTE — Progress Notes (Signed)
 Subjective: The patient is alert and pleasant.  She looks and feels "pretty good".  Objective: Vital signs in last 24 hours: Temp:  [98 F (36.7 C)-98.7 F (37.1 C)] 98.4 F (36.9 C) (05/09 1400) Pulse Rate:  [89-115] 115 (05/09 1430) Resp:  [10-21] 18 (05/09 1430) BP: (107-160)/(47-108) 107/47 (05/09 1430) SpO2:  [90 %-100 %] 92 % (05/09 1430) Weight:  [61.2 kg] 61.2 kg (05/09 0809) Estimated body mass index is 21.79 kg/m as calculated from the following:   Height as of this encounter: 5\' 6"  (1.676 m).   Weight as of this encounter: 61.2 kg.   Intake/Output from previous day: No intake/output data recorded. Intake/Output this shift: Total I/O In: 762.3 [I.V.:167.3; Blood:315; IV Piggyback:280] Out: 60 [Urine:50; Blood:10]  Physical exam the patient is alert and pleasant.  Her lower extremity strength is normal.  Lab Results: Recent Labs    06/13/23 1530 06/14/23 0819  WBC 9.9  --   HGB 7.5* 7.1*  HCT 23.3* 21.0*  PLT 258  --    BMET Recent Labs    06/13/23 1530 06/14/23 0819  NA 136 134*  K 3.7 3.9  CL 100 98  CO2 25  --   GLUCOSE 111* 124*  BUN 12 10  CREATININE 0.54 0.60  CALCIUM 8.7*  --     Studies/Results: MR Lumbar Spine W Wo Contrast Result Date: 06/14/2023 CLINICAL DATA:  Recent L2-L5 decompression and fusion 3 days ago presenting with new onset bilateral lower extremity weakness. Evaluating for possible hematoma/seroma with cord compression posterior epidural fluid EXAM: MRI LUMBAR SPINE WITHOUT AND WITH CONTRAST TECHNIQUE: Multiplanar and multiecho pulse sequences of the lumbar spine were obtained without and with intravenous contrast. CONTRAST:  6mL GADAVIST  GADOBUTROL  1 MMOL/ML IV SOLN COMPARISON:  MRI March 22, 2023. FINDINGS: Segmentation: Standard segmentation is assumed. Alignment:  No substantial sagittal subluxation. Vertebrae: Interval L2-L5 PLIF. Associated edema is most likely postoperative but nonspecific. Conus medullaris and cauda  equina: Conus extends to the L1-L2 level. The conus is anteriorly displaced by the fluid collection ascribed below. Paraspinal and other soft tissues: Postoperative changes in the posterior paraspinal soft tissues. Disc levels: Lobulated collection which extends from approximately T11-T12 inferiorly throughout the lumbar canal into the sacral canal. The collection measures up to 8 mm in thickness at L2-L3 and is circumferential at this level. Resulting severe canal stenosis at multiple levels, greatest at L1-L2 to L4-L5. Moderate canal stenosis at T12-L1. IMPRESSION: Interval L2-L5 PLIF. New large lobulated collection within the canal which extends from T11-T12 inferiorly throughout the lumbar and sacral canal, compatible with hematoma given the clinical history. The collection measures up to 8 mm in thickness at L2-L3 with severe canal stenosis at L1-L2 to L4-L5. Moderate canal stenosis at T12-L1. Findings discussed with Dr. Genette Kent via telephone at 12:26 AM. Electronically Signed   By: Stevenson Elbe M.D.   On: 06/14/2023 00:28    Assessment/Plan: The patient is doing well.  LOS: 0 days     [name] 06/14/2023, 2:49 PM

## 2023-06-14 NOTE — ED Provider Notes (Signed)
 Dr. Larrie Po with neurosurgery will admit the patient for surgery.   Vanessa Rue, DO 06/14/23 [phone]

## 2023-06-14 NOTE — ED Notes (Signed)
 ED to ED; Accepting Dr. Carol Chroman & call report to [phone]. [facility] has dispatched a unit

## 2023-06-15 LAB — BPAM RBC
Blood Product Expiration Date: 202506082359
ISSUE DATE / TIME: 202505090930
Unit Type and Rh: 5100

## 2023-06-15 LAB — TYPE AND SCREEN
ABO/RH(D): O POS
Antibody Screen: NEGATIVE
Unit division: 0

## 2023-06-15 MED ORDER — POLYETHYLENE GLYCOL 3350 17 G PO PACK
17.0000 g | PACK | Freq: Every day | ORAL | Status: DC | PRN
  Administered 2023-06-15: 17 g via ORAL
  Filled 2023-06-15: qty 1

## 2023-06-15 NOTE — Progress Notes (Signed)
 Subjective: The patient is alert and pleasant.  Her back is appropriately sore.  She looks well.  Objective: Vital signs in last 24 hours: Temp:  [97.7 F (36.5 C)-98.4 F (36.9 C)] 98.1 F (36.7 C) (05/10 0721) Pulse Rate:  [78-123] 78 (05/10 0312) Resp:  [10-24] 18 (05/10 0721) BP: (106-160)/(47-81) 147/76 (05/10 0721) SpO2:  [90 %-100 %] 98 % (05/10 0312) Estimated body mass index is 21.79 kg/m as calculated from the following:   Height as of this encounter: 5\' 6"  (1.676 m).   Weight as of this encounter: 61.2 kg.   Intake/Output from previous day: 05/09 0701 - 05/10 0700 In: 1652.6 [I.V.:857.6; Blood:315; IV Piggyback:480] Out: 120 [Urine:50; Drains:60; Blood:10] Intake/Output this shift: Total I/O In: -  Out: 80 [Drains:80]  Physical exam the patient is alert and pleasant.  Her lower extremity strength is grossly normal.  Her dressing is clean and dry.  Her drains have put out 8 cc.  Lab Results: Recent Labs    06/13/23 1530 06/14/23 0819  WBC 9.9  --   HGB 7.5* 7.1*  HCT 23.3* 21.0*  PLT 258  --    BMET Recent Labs    06/13/23 1530 06/14/23 0819  NA 136 134*  K 3.7 3.9  CL 100 98  CO2 25  --   GLUCOSE 111* 124*  BUN 12 10  CREATININE 0.54 0.60  CALCIUM 8.7*  --     Studies/Results: MR Lumbar Spine W Wo Contrast Result Date: 06/14/2023 CLINICAL DATA:  Recent L2-L5 decompression and fusion 3 days ago presenting with new onset bilateral lower extremity weakness. Evaluating for possible hematoma/seroma with cord compression posterior epidural fluid EXAM: MRI LUMBAR SPINE WITHOUT AND WITH CONTRAST TECHNIQUE: Multiplanar and multiecho pulse sequences of the lumbar spine were obtained without and with intravenous contrast. CONTRAST:  6mL GADAVIST  GADOBUTROL  1 MMOL/ML IV SOLN COMPARISON:  MRI March 22, 2023. FINDINGS: Segmentation: Standard segmentation is assumed. Alignment:  No substantial sagittal subluxation. Vertebrae: Interval L2-L5 PLIF. Associated  edema is most likely postoperative but nonspecific. Conus medullaris and cauda equina: Conus extends to the L1-L2 level. The conus is anteriorly displaced by the fluid collection ascribed below. Paraspinal and other soft tissues: Postoperative changes in the posterior paraspinal soft tissues. Disc levels: Lobulated collection which extends from approximately T11-T12 inferiorly throughout the lumbar canal into the sacral canal. The collection measures up to 8 mm in thickness at L2-L3 and is circumferential at this level. Resulting severe canal stenosis at multiple levels, greatest at L1-L2 to L4-L5. Moderate canal stenosis at T12-L1. IMPRESSION: Interval L2-L5 PLIF. New large lobulated collection within the canal which extends from T11-T12 inferiorly throughout the lumbar and sacral canal, compatible with hematoma given the clinical history. The collection measures up to 8 mm in thickness at L2-L3 with severe canal stenosis at L1-L2 to L4-L5. Moderate canal stenosis at T12-L1. Findings discussed with Dr. Genette Kent via telephone at 12:26 AM. Electronically Signed   By: Stevenson Elbe M.D.   On: 06/14/2023 00:28    Assessment/Plan: Postop day 1 status post evacuation of epidural hematoma: The patient is doing well.  We will discontinue her drains tomorrow.  We will mobilize with PT.  Hopefully we can get her home in a day or 2.  LOS: 1 day     [name] 06/15/2023, 9:03 AM     Patient ID: Vanessa Dominguez, female   DOB: Feb 15, 1945, 78 y.o.   MRN: [id number]

## 2023-06-15 NOTE — Progress Notes (Signed)
 At the beginning of the shift, pt's hemavac was assessed to be pulled and almost out from pt's back. Dsg reinforced to secure hemavac tubing but, hemavac kept on inflating and not charging when compressed. Dr. Cabbell on unit notified and he verbally ordered to remove drains and apply dsg. Hemavac x2 removed, clean, sterile dsg applied to back. Both hemavac tubing tips intact with no discharge noted. P. Amo Jaydeen Odor RN

## 2023-06-15 NOTE — Evaluation (Signed)
 Physical Therapy Evaluation Patient Details Name: Vanessa Dominguez MRN: [id number] DOB: 1945/10/21 Today's Date: 06/15/2023  History of Present Illness  The pt is a 78 yo female presenting 5/8 with back pain after a fall at home. Pt with L2-4 PLIF on 5/5, d/c home on 5/6. MRI showed epidural hematoma L2-3 and pt taken for evacuation 5/9. PMH includes: arthritis, LBBB, L TKA.  Clinical Impression  Pt in bed upon arrival of PT, agreeable to evaluation at this time. Prior to admission, the pt had returned home with use of RW and assist from her spouse for all mobility and ADLs. The pt reports she fell on morning of 5/7 due to her knees buckling, then had continued pain and difficulty with mobility prompting return to ED on 5/8. The pt was able to complete sit-stand transfer with CGA and cues for UE use. She was limited by pain in her back and L hip/leg, but did not have any buckling this morning. Pt presenting with mostly with weakness in L ankle DF, and decreased sensation in L lateral ankle and L calf. The pt was educated on spinal precautions (she had no recall from prior admission), encouraged to continue with progressive mobility and position changes. Mobility limited by pain at this time, will need stair practice and further education prior to anticipated d/c home with OPPT for continued strengthening of LLE.         If plan is discharge home, recommend the following: A little help with walking and/or transfers;A little help with bathing/dressing/bathroom;Assistance with cooking/housework;Assist for transportation;Help with stairs or ramp for entrance   Can travel by private vehicle        Equipment Recommendations None recommended by PT  Recommendations for Other Services       Functional Status Assessment Patient has had a recent decline in their functional status and demonstrates the ability to make significant improvements in function in a reasonable and predictable amount of time.      Precautions / Restrictions Precautions Precautions: Fall;Back Precaution Booklet Issued: Yes (comment) Recall of Precautions/Restrictions: Impaired Precaution/Restrictions Comments: pt with no recall of precautions from prior surgery, educated in session Required Braces or Orthoses: Spinal Brace Spinal Brace: Lumbar corset;Applied in sitting position Restrictions Weight Bearing Restrictions Per Provider Order: No      Mobility  Bed Mobility               General bed mobility comments: not tested, pt in chair at start and end of session    Transfers Overall transfer level: Needs assistance Equipment used: Rolling walker (2 wheels) Transfers: Sit to/from Stand Sit to Stand: Contact guard assist           General transfer comment: VCs for safe hand placement and to look up for sit<>stand    Ambulation/Gait Ambulation/Gait assistance: Contact guard assist Gait Distance (Feet): 150 Feet Assistive device: Rolling walker (2 wheels) Gait Pattern/deviations: Step-through pattern, Decreased stride length, Trunk flexed, Narrow base of support Gait velocity: Decreased Gait velocity interpretation: <1.31 ft/sec, indicative of household ambulator   General Gait Details: pt with near scissoring steps at time, minimal DF with LLE which progressed as she fatigued. head down, heavy dependence on UE     Balance Overall balance assessment: Mild deficits observed, not formally tested, History of Falls Sitting-balance support: No upper extremity supported Sitting balance-Leahy Scale: Fair     Standing balance support: Reliant on assistive device for balance, Bilateral upper extremity supported, During functional activity Standing balance-Leahy Scale: Poor  Standing balance comment: dependent on UE support                             Pertinent Vitals/Pain Pain Assessment Pain Assessment: 0-10 Pain Score: 8  Pain Location: Incision site Pain Descriptors /  Indicators: Operative site guarding, Sore Pain Intervention(s): Limited activity within patient's tolerance, Monitored during session, Repositioned    Home Living Family/patient expects to be discharged to:: Private residence Living Arrangements: Spouse/significant other Available Help at Discharge: Family;Available 24 hours/day Type of Home: House Home Access: Stairs to enter Entrance Stairs-Rails: None Entrance Stairs-Number of Steps: 2 Alternate Level Stairs-Number of Steps: flgiht Home Layout: Two level;Able to live on main level with bedroom/bathroom Home Equipment: Rolling Walker (2 wheels);Shower seat - built in;Toilet riser Additional Comments: pt reports difficulty walking at home    Prior Function Prior Level of Function : Independent/Modified Independent;Driving             Mobility Comments: typically independent, using RW since d/c. buckling of legs to cause fall wednesday morning ADLs Comments: spouse assisting with ALDs after d/c     Extremity/Trunk Assessment   Upper Extremity Assessment Upper Extremity Assessment: LUE deficits/detail LUE Deficits / Details: Pt reports prior to surgery her LUE was working completely normal her RUE, but not it feels weak. She is 4/5 throughout and you can visibly see the strength and coordination deficit when she raises her LUE compared to her RUE and in difficulty with pulling pants up. Encouraged her to continue to use her LUE functionally and to let her surgeon know at post op visit if it is still not normal LUE Coordination: decreased fine motor;decreased gross motor    Lower Extremity Assessment Lower Extremity Assessment: LLE deficits/detail LLE Deficits / Details: grossly 4-/5 to MMT, especially at ankle. decreased sensation to lateral ankle and calf LLE Sensation: decreased light touch    Cervical / Trunk Assessment Cervical / Trunk Assessment: Back Surgery  Communication   Communication Communication: No apparent  difficulties Factors Affecting Communication: Hearing impaired    Cognition Arousal: Alert Behavior During Therapy: WFL for tasks assessed/performed   PT - Cognitive impairments: No apparent impairments                         Following commands: Intact       Cueing Cueing Techniques: Verbal cues     General Comments General comments (skin integrity, edema, etc.): VSS, pt with 2 drains    Exercises General Exercises - Lower Extremity Ankle Circles/Pumps: AROM, Both, 15 reps Toe Raises: AROM, Left, 15 reps   Assessment/Plan    PT Assessment Patient needs continued PT services  PT Problem List Decreased strength;Decreased activity tolerance;Decreased balance;Decreased mobility;Decreased safety awareness;Pain       PT Treatment Interventions DME instruction;Gait training;Stair training;Functional mobility training;Therapeutic activities;Therapeutic exercise;Balance training;Patient/family education    PT Goals (Current goals can be found in the Care Plan section)  Acute Rehab PT Goals Patient Stated Goal: to get stronger and go home PT Goal Formulation: With patient Time For Goal Achievement: 06/29/23 Potential to Achieve Goals: Good    Frequency Min 5X/week        AM-PAC PT "6 Clicks" Mobility  Outcome Measure Help needed turning from your back to your side while in a flat bed without using bedrails?: A Little Help needed moving from lying on your back to sitting on the side of a flat bed  without using bedrails?: A Lot Help needed moving to and from a bed to a chair (including a wheelchair)?: A Little Help needed standing up from a chair using your arms (e.g., wheelchair or bedside chair)?: A Little Help needed to walk in hospital room?: A Little Help needed climbing 3-5 steps with a railing? : A Lot 6 Click Score: 16    End of Session Equipment Utilized During Treatment: Gait belt;Back brace Activity Tolerance: Patient tolerated treatment  well Patient left: in chair;with call bell/phone within reach;with chair alarm set Nurse Communication: Mobility status PT Visit Diagnosis: Unsteadiness on feet (R26.81);Pain Pain - Right/Left: Left Pain - part of body:  (back)    Time: 4259-5638 PT Time Calculation (min) (ACUTE ONLY): 30 min   Charges:   PT Evaluation $PT Eval Moderate Complexity: 1 Mod PT Treatments $Therapeutic Exercise: 8-22 mins PT General Charges $$ ACUTE PT VISIT: 1 Visit         Barnabas Booth, PT, DPT   Acute Rehabilitation Department Office [phone] Secure Chat Communication Preferred  Vanessa Dominguez 06/15/2023, 9:48 AM

## 2023-06-16 LAB — CBC
HCT: 26.3 % — ABNORMAL LOW (ref 36.0–46.0)
Hemoglobin: 8.3 g/dL — ABNORMAL LOW (ref 12.0–15.0)
MCH: 30.4 pg (ref 26.0–34.0)
MCHC: 31.6 g/dL (ref 30.0–36.0)
MCV: 96.3 fL (ref 80.0–100.0)
Platelets: 351 10*3/uL (ref 150–400)
RBC: 2.73 MIL/uL — ABNORMAL LOW (ref 3.87–5.11)
RDW: 13.5 % (ref 11.5–15.5)
WBC: 8.4 10*3/uL (ref 4.0–10.5)
nRBC: 0 % (ref 0.0–0.2)

## 2023-06-16 NOTE — Progress Notes (Signed)
 Physical Therapy Treatment Patient Details Name: Vanessa Dominguez MRN: [id number] DOB: November 16, 1945 Today's Date: 06/16/2023   History of Present Illness The pt is a 78 yo female presenting 5/8 with back pain after a fall at home. Pt with L2-4 PLIF on 5/5, d/c home on 5/6. MRI showed epidural hematoma L2-3 and pt taken for evacuation 5/9. PMH includes: arthritis, LBBB, L TKA.    PT Comments  The pt presents with increased pain this morning, and therefore required increased assistance for bed mobility and sit-stand transfers. She also reported new onset heaviness and weakness in LUE after attempt to reposition in bed overnight, no pronator drift noted and coordination tested and intact. Pt was able to complete stair training, but is dependent on BUE support and as she has no rails at home will require +2 assist to manage stairs at home, spouse present and aware. Education on L ankle DF exercises reinforced, pt with better ROM noted today, but continued deficits with DF noted with ambulation, especially when fatigued. Recommendations remain appropriate.    If plan is discharge home, recommend the following: A little help with walking and/or transfers;A little help with bathing/dressing/bathroom;Assistance with cooking/housework;Assist for transportation;Help with stairs or ramp for entrance   Can travel by private vehicle        Equipment Recommendations  None recommended by PT    Recommendations for Other Services       Precautions / Restrictions Precautions Precautions: Fall;Back Precaution Booklet Issued: Yes (comment) Recall of Precautions/Restrictions: Impaired Precaution/Restrictions Comments: pt with no recall of precautions from prior surgery, educated in session Required Braces or Orthoses: Spinal Brace Spinal Brace: Lumbar corset;Applied in sitting position Restrictions Weight Bearing Restrictions Per Provider Order: No     Mobility  Bed Mobility Overal bed mobility: Needs  Assistance Bed Mobility: Rolling, Sidelying to Sit Rolling: Min assist, Used rails Sidelying to sit: Mod assist, HOB elevated, Used rails       General bed mobility comments: assist to complete due to pain, difficulty with elevation of trunk    Transfers Overall transfer level: Needs assistance Equipment used: Rolling walker (2 wheels) Transfers: Sit to/from Stand Sit to Stand: Min assist           General transfer comment: minA to power up and cues for hand placement, pt unable to rise without assist on all attempts    Ambulation/Gait Ambulation/Gait assistance: Contact guard assist Gait Distance (Feet): 200 Feet Assistive device: Rolling walker (2 wheels) Gait Pattern/deviations: Step-through pattern, Decreased stride length, Trunk flexed, Narrow base of support, Decreased dorsiflexion - left Gait velocity: Decreased Gait velocity interpretation: <1.31 ft/sec, indicative of household ambulator   General Gait Details: pt with near scissoring steps at time, minimal DF with LLE which progressed as she fatigued. head down, heavy dependence on UE   Stairs Stairs: Yes Stairs assistance: Min assist Stair Management: One rail Right, Step to pattern, Forwards Number of Stairs: 1 General stair comments: cues for technique, pt does not have rails at home so encouraged to have increased assistance      Balance Overall balance assessment: Mild deficits observed, not formally tested, History of Falls Sitting-balance support: No upper extremity supported Sitting balance-Leahy Scale: Fair     Standing balance support: Reliant on assistive device for balance, Bilateral upper extremity supported, During functional activity Standing balance-Leahy Scale: Poor Standing balance comment: dependent on UE support  Communication Communication Communication: No apparent difficulties  Cognition Arousal: Alert Behavior During Therapy: WFL for tasks  assessed/performed   PT - Cognitive impairments: No apparent impairments                       PT - Cognition Comments: pt with some issues with memory of spinal precautions, poor awareness of assistance needed for stairs. generally poor problem solving in regards to safety Following commands: Intact      Cueing Cueing Techniques: Verbal cues  Exercises General Exercises - Lower Extremity Ankle Circles/Pumps: AROM, Both, 15 reps Toe Raises: AROM, Left, Other reps (comment) (for 30 sec intervals)    General Comments General comments (skin integrity, edema, etc.): pt reports new onset heaviness and weakness in LUE, no pronator drift and coordination tested and intact. pt reports started after attempt to reposition in bed overnight.      Pertinent Vitals/Pain Pain Assessment Pain Assessment: 0-10 Pain Score: 8  Faces Pain Scale: Hurts whole lot Pain Location: Incision site, L hip Pain Descriptors / Indicators: Operative site guarding, Sore Pain Intervention(s): Limited activity within patient's tolerance, Monitored during session, Premedicated before session, Repositioned     PT Goals (current goals can now be found in the care plan section) Acute Rehab PT Goals Patient Stated Goal: to get stronger and go home PT Goal Formulation: With patient Time For Goal Achievement: 06/29/23 Potential to Achieve Goals: Good Progress towards PT goals: Progressing toward goals    Frequency    Min 5X/week       AM-PAC PT "6 Clicks" Mobility   Outcome Measure  Help needed turning from your back to your side while in a flat bed without using bedrails?: A Little Help needed moving from lying on your back to sitting on the side of a flat bed without using bedrails?: A Lot Help needed moving to and from a bed to a chair (including a wheelchair)?: A Little Help needed standing up from a chair using your arms (e.g., wheelchair or bedside chair)?: A Little Help needed to walk in  hospital room?: A Little Help needed climbing 3-5 steps with a railing? : A Lot 6 Click Score: 16    End of Session Equipment Utilized During Treatment: Gait belt;Back brace Activity Tolerance: Patient tolerated treatment well Patient left: in chair;with call bell/phone within reach;with chair alarm set Nurse Communication: Mobility status PT Visit Diagnosis: Unsteadiness on feet (R26.81);Pain Pain - Right/Left: Left Pain - part of body:  (back)     Time: 1610-9604 PT Time Calculation (min) (ACUTE ONLY): 40 min  Charges:    $Gait Training: 8-22 mins $Therapeutic Exercise: 8-22 mins $Therapeutic Activity: 8-22 mins PT General Charges $$ ACUTE PT VISIT: 1 Visit                     Barnabas Booth, PT, DPT   Acute Rehabilitation Department Office [phone] Secure Chat Communication Preferred   Lona Rist 06/16/2023, 10:01 AM

## 2023-06-17 ENCOUNTER — Encounter (HOSPITAL_COMMUNITY): Payer: Self-pay | Admitting: Neurosurgery

## 2023-06-17 MED ORDER — CYCLOBENZAPRINE HCL 5 MG PO TABS
5.0000 mg | ORAL_TABLET | Freq: Three times a day (TID) | ORAL | Status: DC | PRN
  Administered 2023-06-17 – 2023-06-18 (×2): 5 mg via ORAL
  Filled 2023-06-17 (×2): qty 1

## 2023-06-17 MED ORDER — OXYCODONE-ACETAMINOPHEN 5-325 MG PO TABS
1.0000 | ORAL_TABLET | ORAL | Status: DC | PRN
  Administered 2023-06-17: 2 via ORAL
  Administered 2023-06-17 – 2023-06-18 (×3): 1 via ORAL
  Filled 2023-06-17 (×3): qty 1
  Filled 2023-06-17: qty 2

## 2023-06-17 MED ORDER — OXYCODONE-ACETAMINOPHEN 5-325 MG PO TABS: 1.0000 | ORAL_TABLET | ORAL | 0 refills | Status: DC | PRN

## 2023-06-17 NOTE — TOC Transition Note (Signed)
 Transition of Care (TOC) - Discharge Note Sherin Dingwall RN,BSN Transitions of Care Unit 4NP (Non Trauma)- RN Case Manager See Treatment Team for direct Phone #   Patient Details  Name: Vanessa Dominguez MRN: [id number] Date of Birth: 14-Jun-1945  Transition of Care [facility]) CM/SW Contact:  Rox Cope, RN Phone Number: 06/17/2023, 2:19 PM   Clinical Narrative:    Noted per MD note pt stable for transition home, order for HHPT placed.   CM spoke with pt at bedside, Discussed HH and DME needs. Per pt she has all needed DME at home- no new DME needs noted at this time. List provided for [facility] choice Per CMS guidelines from [email or web address] website with star ratings (copy placed in shadow chart)- pt voiced she would like to use [facility] for [facility] services.   Address, phone #s and PCP all confirmed. Spouse to transport home.   Call made to [facility] liaison- referral has been accepted for HHPT needs.  [facility] to call pt and schedule start of care visit this week.   No further TOC intervention needs noted.    Final next level of care: Home w Home Health Services Barriers to Discharge: No Barriers Identified   Patient Goals and CMS Choice Patient states their goals for this hospitalization and ongoing recovery are:: to return home CMS Medicare.gov Compare Post Acute Care list provided to:: Patient Choice offered to / list presented to : Patient      Discharge Placement               Home w/ [facility]        Discharge Plan and Services Additional resources added to the After Visit Summary for   In-house Referral: NA Discharge Planning Services: CM Consult Post Acute Care Choice: Home Health          DME Arranged: N/A DME Agency: NA       HH Arranged: PT HH Agency: [facility] Date [facility] Agency Contacted: 06/17/23 Time HH Agency Contacted: 1418 Representative spoke with at [facility] Agency: Johnson Nanny  Social Drivers of Health (SDOH) Interventions SDOH  Screenings   Food Insecurity: No Food Insecurity (05/08/2023)  Housing: Low Risk  (05/08/2023)  Transportation Needs: No Transportation Needs (05/08/2023)  Utilities: Not At Risk (05/08/2023)  Alcohol Screen: Low Risk  (05/08/2023)  Depression (PHQ2-9): Low Risk  (05/08/2023)  Financial Resource Strain: Low Risk  (05/08/2023)  Physical Activity: Sufficiently Active (05/08/2023)  Social Connections: Unknown (05/08/2023)  Stress: No Stress Concern Present (05/08/2023)  Tobacco Use: Medium Risk (06/14/2023)  Health Literacy: Adequate Health Literacy (05/08/2023)     Readmission Risk Interventions    06/17/2023    2:19 PM  Readmission Risk Prevention Plan  Post Dischage Appt Complete  Medication Screening Complete  Transportation Screening Complete

## 2023-06-17 NOTE — Plan of Care (Signed)
  Problem: Education: Goal: Ability to verbalize activity precautions or restrictions will improve Outcome: Progressing Goal: Knowledge of the prescribed therapeutic regimen will improve Outcome: Progressing Goal: Understanding of discharge needs will improve Outcome: Progressing   Problem: Activity: Goal: Ability to avoid complications of mobility impairment will improve Outcome: Progressing Goal: Ability to tolerate increased activity will improve Outcome: Progressing Goal: Will remain free from falls Outcome: Progressing   Problem: Bowel/Gastric: Goal: Gastrointestinal status for postoperative course will improve Outcome: Progressing

## 2023-06-17 NOTE — Discharge Summary (Signed)
 Physician Discharge Summary  Patient ID: Vanessa Dominguez MRN: [id number] DOB/AGE: 06/25/1945 78 y.o.  Admit date: 06/14/2023 Discharge date: 06/17/2023  Admission Diagnoses: Lumbar epidural hematoma, cauda equina syndrome  Discharge Diagnoses: The same Principal Problem:   Epidural hematoma [facility])   Discharged Condition: good  Hospital Course: I evacuated a lumbar epidural hematoma on the patient on 06/14/2023.  The patient's postoperative course was unremarkable.  The patient's pain, weakness and urinary retention resolved.  She has progressively mobilized.  On 06/17/2023 the patient requested discharge to home.  She was given verbal and written discharge instructions.  Arrangements were made for home physical therapy.  All her questions were answered.  Consults: PT, care management Significant Diagnostic Studies: Lumbar MRI Treatments: Evacuation of lumbar epidural hematoma Discharge Exam: Blood pressure 112/76, pulse (!) 102, temperature 97.8 F (36.6 C), temperature source Oral, resp. rate 20, height 5\' 6"  (1.676 m), weight 61.2 kg, SpO2 92%. The patient is alert and pleasant.  Her strength is normal.  Her dressing is clean and dry.  Disposition: Home   Allergies as of 06/17/2023   No Known Allergies      Medication List     TAKE these medications    CALCIUM PO Take 1 tablet by mouth daily.   cyclobenzaprine  5 MG tablet Commonly known as: FLEXERIL  Take 1 tablet (5 mg total) by mouth 3 (three) times daily as needed for muscle spasms.   docusate sodium  100 MG capsule Commonly known as: COLACE Take 1 capsule (100 mg total) by mouth 2 (two) times daily.   gabapentin  300 MG capsule Commonly known as: NEURONTIN  TAKE 1 CAPSULE 3 TIMES DAILY   MAGNESIUM PO Take 1 tablet by mouth daily.   omeprazole  20 MG capsule Commonly known as: PRILOSEC Take 1 capsule (20 mg total) by mouth daily. What changed:  when to take this reasons to take this   oxyCODONE -acetaminophen   5-325 MG tablet Commonly known as: PERCOCET/ROXICET Take 1-2 tablets by mouth every 4 (four) hours as needed for moderate pain (pain score 4-6).   vitamin E 180 MG (400 UNITS) capsule Take 400 Units by mouth daily.         Signed: [name] 06/17/2023, 12:59 PM

## 2023-06-17 NOTE — Anesthesia Postprocedure Evaluation (Signed)
 Anesthesia Post Note  Patient: Vanessa Dominguez  Procedure(s) Performed: EVACUATION LUMBAR EPIDURAL HEMATOMA     Patient location during evaluation: PACU Anesthesia Type: General Level of consciousness: awake and alert Pain management: pain level controlled Vital Signs Assessment: post-procedure vital signs reviewed and stable Respiratory status: spontaneous breathing, nonlabored ventilation, respiratory function stable and patient connected to nasal cannula oxygen Cardiovascular status: blood pressure returned to baseline and stable Postop Assessment: no apparent nausea or vomiting Anesthetic complications: no   There were no known notable events for this encounter.  Last Vitals:  Vitals:   06/17/23 0349 06/17/23 0750  BP: 137/69 116/68  Pulse: 91 (!) 102  Resp: 18 16  Temp: 36.8 C 36.8 C  SpO2: 94% (!) 88%    Last Pain:  Vitals:   06/17/23 0901  TempSrc:   PainSc: 7                  Myan [address]

## 2023-06-17 NOTE — Care Management Important Message (Signed)
 Important Message  Patient Details  Name: Vanessa Dominguez MRN: [id number] Date of Birth: [birth year]   Important Message Given:  Yes - Medicare IM     Felix Host 06/17/2023, 1:45 PM

## 2023-06-17 NOTE — Progress Notes (Signed)
 Physical Therapy Treatment Patient Details Name: Vanessa Dominguez MRN: [id number] DOB: August 04, 1945 Today's Date: 06/17/2023   History of Present Illness The pt is a 77 yo female presenting 5/8 with back pain after a fall at home. Pt with L2-4 PLIF on 5/5, d/c home on 5/6. MRI showed epidural hematoma L2-3 and pt taken for evacuation 5/9. PMH includes: arthritis, LBBB, L TKA.    PT Comments  The pt continues to make slow but steady progress with mobility this session. She continues to have deficits in recall of spinal precautions despite education daily during PT sessions during admission. She also demos impairments in flexibility and LE strength to allow for ADL management without breaking spinal precautions, recommend OT consult. The pt was able to progress walking distance this session and practice stairs again, but continues to depend on modA to manage power up on stairs and min-modA to manage sit-stand transfer with mod cues for hand placement. Overall, pt progressing slowly, continues to have weakness in L ankle DF making her at increased risk for further falls and reports heaviness in LUE as well after surgery but was able to use functionally on RW.     If plan is discharge home, recommend the following: A little help with walking and/or transfers;A little help with bathing/dressing/bathroom;Assistance with cooking/housework;Assist for transportation;Help with stairs or ramp for entrance   Can travel by private vehicle        Equipment Recommendations  None recommended by PT    Recommendations for Other Services OT consult     Precautions / Restrictions Precautions Precautions: Fall;Back Precaution Booklet Issued: Yes (comment) Recall of Precautions/Restrictions: Impaired Precaution/Restrictions Comments: pt with no recall of precautions from prior surgery, educated in session Required Braces or Orthoses: Spinal Brace Spinal Brace: Lumbar corset;Applied in sitting  position Restrictions Weight Bearing Restrictions Per Provider Order: No     Mobility  Bed Mobility               General bed mobility comments: OOB at start and end of session    Transfers Overall transfer level: Needs assistance Equipment used: Rolling walker (2 wheels) Transfers: Sit to/from Stand Sit to Stand: Min assist           General transfer comment: slow to power up, poor strength through LE. repeated cues for hand placement    Ambulation/Gait Ambulation/Gait assistance: Contact guard assist Gait Distance (Feet): 300 Feet Assistive device: Rolling walker (2 wheels) Gait Pattern/deviations: Step-through pattern, Decreased stride length, Trunk flexed, Narrow base of support, Decreased dorsiflexion - left Gait velocity: Decreased     General Gait Details: minimal DF with LLE which progressed as she fatigued. head down, heavy dependence on UE. no overt LOB, cues for maintaining spinal precautions with mobility   Stairs   Stairs assistance: Mod assist Stair Management: Step to pattern, Forwards, No rails (R HHA) Number of Stairs: 2 General stair comments: LUE on wall (not rail) and pt with HHA in RUE to mimic home environment. Pt needing increased assist on R side to power up   Wheelchair Mobility     Tilt Bed    Modified Rankin (Stroke Patients Only)       Balance Overall balance assessment: Mild deficits observed, not formally tested, History of Falls Sitting-balance support: No upper extremity supported Sitting balance-Leahy Scale: Fair     Standing balance support: Reliant on assistive device for balance, Bilateral upper extremity supported, During functional activity Standing balance-Leahy Scale: Poor Standing balance comment: dependent on UE  support                            Communication Communication Communication: No apparent difficulties  Cognition Arousal: Alert Behavior During Therapy: WFL for tasks  assessed/performed   PT - Cognitive impairments: Memory, Problem solving, Safety/Judgement                       PT - Cognition Comments: pt with some issues with memory of spinal precautions, poor awareness of assistance needed for stairs. generally poor problem solving in regards to safety Following commands: Intact      Cueing Cueing Techniques: Verbal cues  Exercises General Exercises - Lower Extremity Ankle Circles/Pumps: AROM, Both, 15 reps Toe Raises: AROM, Left, Other reps (comment) (for 30 sec intervals)    General Comments        Pertinent Vitals/Pain Pain Assessment Pain Assessment: 0-10 Pain Score: 4  Faces Pain Scale: Hurts little more Pain Location: Incision site, L hip Pain Descriptors / Indicators: Operative site guarding, Sore     PT Goals (current goals can now be found in the care plan section) Acute Rehab PT Goals Patient Stated Goal: to get stronger and go home PT Goal Formulation: With patient Time For Goal Achievement: 06/29/23 Potential to Achieve Goals: Good Progress towards PT goals: Progressing toward goals    Frequency    Min 5X/week       AM-PAC PT "6 Clicks" Mobility   Outcome Measure  Help needed turning from your back to your side while in a flat bed without using bedrails?: A Little Help needed moving from lying on your back to sitting on the side of a flat bed without using bedrails?: A Lot Help needed moving to and from a bed to a chair (including a wheelchair)?: A Little Help needed standing up from a chair using your arms (e.g., wheelchair or bedside chair)?: A Little Help needed to walk in hospital room?: A Little Help needed climbing 3-5 steps with a railing? : A Lot 6 Click Score: 16    End of Session Equipment Utilized During Treatment: Gait belt;Back brace Activity Tolerance: Patient tolerated treatment well Patient left: in chair;with call bell/phone within reach;with chair alarm set Nurse Communication:  Mobility status PT Visit Diagnosis: Unsteadiness on feet (R26.81);Pain Pain - Right/Left: Left Pain - part of body:  (back)     Time: 1610-9604 PT Time Calculation (min) (ACUTE ONLY): 26 min  Charges:    $Gait Training: 8-22 mins $Therapeutic Activity: 8-22 mins PT General Charges $$ ACUTE PT VISIT: 1 Visit                     Barnabas Booth, PT, DPT   Acute Rehabilitation Department Office [phone] Secure Chat Communication Preferred   Lona Rist 06/17/2023, 12:38 PM

## 2023-06-18 NOTE — Plan of Care (Signed)
  Problem: Education: Goal: Ability to verbalize activity precautions or restrictions will improve Outcome: Progressing Goal: Knowledge of the prescribed therapeutic regimen will improve Outcome: Progressing Goal: Understanding of discharge needs will improve Outcome: Progressing   Problem: Activity: Goal: Ability to avoid complications of mobility impairment will improve Outcome: Progressing Goal: Ability to tolerate increased activity will improve Outcome: Progressing Goal: Will remain free from falls Outcome: Progressing   Problem: Bowel/Gastric: Goal: Gastrointestinal status for postoperative course will improve Outcome: Progressing

## 2023-06-18 NOTE — Plan of Care (Signed)

## 2023-06-18 NOTE — Evaluation (Signed)
 Occupational Therapy Evaluation Patient Details Name: Vanessa Dominguez MRN: [id number] DOB: 1945-09-29 Today's Date: 06/18/2023   History of Present Illness   The pt is a 78 yo female presenting 5/8 with back pain after a fall at home. Pt with L2-4 PLIF on 5/5, d/c home on 5/6. MRI showed epidural hematoma L2-3 and pt taken for evacuation 5/9. PMH includes: arthritis, LBBB, L TKA.     Clinical Impressions PTA, pt lived with husband who was providing assist with ADL since recent spinal surgery. Pt now s/p hematoma evacuation after a fall at home. Upon eval, pt performing UB ADL with up to min cues for optimal technique for brace application and LB ADL with min A. Pt educated and demonstrating use of compensatory techniques for bed mobility, LB ADL, grooming, toileting, and shower transfers within precautions. All education provided and questions answered. Recommending discharge home with [facility] for initial home safety eval in setting of recent fall at home and for continued education regarding implementation of precautions during functional ADL and mobility at home.        If plan is discharge home, recommend the following:   A little help with walking and/or transfers;A little help with bathing/dressing/bathroom;Assistance with cooking/housework;Assist for transportation;Help with stairs or ramp for entrance     Functional Status Assessment   Patient has had a recent decline in their functional status and demonstrates the ability to make significant improvements in function in a reasonable and predictable amount of time.     Equipment Recommendations   None recommended by OT     Recommendations for Other Services         Precautions/Restrictions   Precautions Precautions: Fall;Back Precaution Booklet Issued: Yes (comment) Recall of Precautions/Restrictions: Impaired Precaution/Restrictions Comments: able to state 3/3 spinal precautions; intermittent min cues to implement  functionally Required Braces or Orthoses: Spinal Brace Spinal Brace: Lumbar corset;Applied in sitting position Restrictions Weight Bearing Restrictions Per Provider Order: No     Mobility Bed Mobility Overal bed mobility: Needs Assistance Bed Mobility: Rolling, Sidelying to Sit Rolling: Used rails, Min assist Sidelying to sit: Min assist       General bed mobility comments: Min A to optimize technique with pt using rails throughout    Transfers   Equipment used: Rolling walker (2 wheels) Transfers: Sit to/from Stand Sit to Stand: Min assist           General transfer comment: Min A up from EOB with poor power up from EOB with BLE. Slow to rise. CGA up from [facility]      Balance Overall balance assessment: Mild deficits observed, not formally tested, History of Falls (benefits from RW)                                         ADL either performed or assessed with clinical judgement   ADL Overall ADL's : Needs assistance/impaired Eating/Feeding: Modified independent;Sitting   Grooming: Wash/dry hands;Standing;Contact guard assist Grooming Details (indicate cue type and reason): also reviewed compensatory techniques for oral care Upper Body Bathing: Set up;Sitting   Lower Body Bathing: Minimal assistance;Sit to/from stand   Upper Body Dressing : Minimal assistance;Sitting Upper Body Dressing Details (indicate cue type and reason): for brace application/cues for optimal technique Lower Body Dressing: Sit to/from stand;With adaptive equipment;Minimal assistance Lower Body Dressing Details (indicate cue type and reason): use of step in shoes for donning shoes;  pt has not been wearing socks; but has had education regarding sock aide. pt uses reacher for threading BLE into/out of pants Toilet Transfer: Contact guard assist;Ambulation;Rolling walker (2 wheels);BSC/3in1   Toileting- Clothing Manipulation and Hygiene: Supervision/safety;Sitting/lateral lean    Tub/ Shower Transfer: Walk-in shower;Contact guard assist;Adhering to back precautions;Ambulation;Shower seat;Rolling walker (2 wheels)   Functional mobility during ADLs: Contact guard assist;Rolling walker (2 wheels)       Vision Patient Visual Report: No change from baseline       Perception         Praxis         Pertinent Vitals/Pain Pain Assessment Pain Assessment: Faces Faces Pain Scale: Hurts little more Pain Location: Incision site, L hip, L arm Pain Descriptors / Indicators: Operative site guarding, Sore Pain Intervention(s): Limited activity within patient's tolerance, Monitored during session     Extremity/Trunk Assessment Upper Extremity Assessment Upper Extremity Assessment: LUE deficits/detail LUE Deficits / Details: Pt reports prior to surgery her LUE was working completely normal her RUE, but now it feels weak. She is 4/5 throughout Noting decr coordination as copmpared to R but using functionally with increased time. Encouraged to continue to use functionally; reach out to MD with any new tingling or numbness as well as update him at follow up appt. LUE Coordination: decreased fine motor;decreased gross motor   Lower Extremity Assessment Lower Extremity Assessment: Defer to PT evaluation   Cervical / Trunk Assessment Cervical / Trunk Assessment: Back Surgery   Communication Communication Communication: No apparent difficulties Factors Affecting Communication: Hearing impaired   Cognition Arousal: Alert Behavior During Therapy: WFL for tasks assessed/performed Cognition: Cognition impaired     Awareness: Online awareness impaired Memory impairment (select all impairments): Short-term memory Attention impairment (select first level of impairment): Selective attention Executive functioning impairment (select all impairments): Problem solving, Organization OT - Cognition Comments: decreased organization to throught pattern, intermittently seems  internally distracted. Pt able to recall 3/3 spinal precautions needing up to min cues to implement during functional ADL esp bed mobility. Needing redirection back to task at hand at times.                 Following commands: Intact       Cueing  General Comments   Cueing Techniques: Verbal cues      Exercises     Shoulder Instructions      Home Living Family/patient expects to be discharged to:: Private residence Living Arrangements: Spouse/significant other Available Help at Discharge: Family;Available 24 hours/day Type of Home: House Home Access: Stairs to enter [employer] of Steps: 2 Entrance Stairs-Rails: None Home Layout: Two level;Able to live on main level with bedroom/bathroom Alternate Level Stairs-Number of Steps: flgiht   Bathroom Shower/Tub: Producer, television/film/video: Handicapped height Bathroom Accessibility: Yes   Home Equipment: Agricultural consultant (2 wheels);Shower seat - built in;Toilet riser   Additional Comments: pt reports difficulty walking at home      Prior Functioning/Environment Prior Level of Function : Independent/Modified Independent;Driving             Mobility Comments: typically independent, using RW since d/c. buckling of legs to cause fall wednesday morning ADLs Comments: spouse assisting with ALDs after d/c    OT Problem List: Decreased strength;Decreased range of motion;Impaired balance (sitting and/or standing);Pain;Impaired UE functional use   OT Treatment/Interventions:        OT Goals(Current goals can be found in the care plan section)   Acute Rehab OT Goals Patient  Stated Goal: go home OT Goal Formulation: With patient Time For Goal Achievement: 07/02/23 Potential to Achieve Goals: Good   OT Frequency:       Co-evaluation              AM-PAC OT "6 Clicks" Daily Activity     Outcome Measure Help from another person eating meals?: None Help from another person taking care of  personal grooming?: A Little Help from another person toileting, which includes using toliet, bedpan, or urinal?: A Little Help from another person bathing (including washing, rinsing, drying)?: A Little Help from another person to put on and taking off regular upper body clothing?: A Little Help from another person to put on and taking off regular lower body clothing?: A Little 6 Click Score: 19   End of Session Equipment Utilized During Treatment: Gait belt;Rolling walker (2 wheels);Back brace Nurse Communication: Mobility status  Activity Tolerance: Patient tolerated treatment well Patient left: in chair;with call bell/phone within reach  OT Visit Diagnosis: Unsteadiness on feet (R26.81);Pain                Time: 6045-4098 OT Time Calculation (min): 30 min Charges:  OT General Charges $OT Visit: 1 Visit OT Evaluation $OT Eval Moderate Complexity: 1 Mod OT Treatments $Self Care/Home Management : 8-22 mins  Karilyn Ouch, OTR/L [facility] Acute Rehabilitation Office: [phone]   Emery Hans 06/18/2023, 9:37 AM

## 2023-06-18 NOTE — Discharge Summary (Signed)
 The patient did not have transportation yesterday so she stayed overnight.  She is ready for discharge today.

## 2023-07-18 ENCOUNTER — Encounter: Payer: Self-pay | Admitting: Family Medicine

## 2023-07-18 ENCOUNTER — Ambulatory Visit: Payer: Medicare PPO | Admitting: Family Medicine

## 2023-07-18 VITALS — BP 127/78 | HR 89 | Resp 14 | Ht 66.0 in | Wt 136.0 lb

## 2023-07-18 DIAGNOSIS — Z1212 Encounter for screening for malignant neoplasm of rectum: Secondary | ICD-10-CM

## 2023-07-18 DIAGNOSIS — D509 Iron deficiency anemia, unspecified: Secondary | ICD-10-CM | POA: Diagnosis not present

## 2023-07-18 DIAGNOSIS — Z1211 Encounter for screening for malignant neoplasm of colon: Secondary | ICD-10-CM

## 2023-07-18 DIAGNOSIS — Z9889 Other specified postprocedural states: Secondary | ICD-10-CM | POA: Diagnosis not present

## 2023-07-18 DIAGNOSIS — E7800 Pure hypercholesterolemia, unspecified: Secondary | ICD-10-CM | POA: Diagnosis not present

## 2023-07-18 NOTE — Progress Notes (Signed)
 Established patient visit   Patient: Vanessa Dominguez   DOB: Nov 27, 1945   78 y.o. Female  MRN: [id number] Visit Date: 07/18/2023  Today's healthcare provider: Carlean Charter, DO   Chief Complaint  Patient presents with   Establish Care    Previous Doctor Donnia Galas. No concerns today. Decline Mammogram.   Subjective    HPI Last annual exam: 04/30/2022   Vanessa Dominguez is a 78 year old female who presents for medication management and refills following recent surgery.  She is currently taking gabapentin  daily and uses oxycodone  as needed. She also takes Dulcolax and Colace for constipation, which she experienced post-surgery. She had difficulty with bowel movements until she started taking these medications regularly. She also has a medication for muscle spasms, which she takes as needed, but has not required it recently.  She underwent surgery on May 5th and had a subsequent hospital visit where she received a blood transfusion. Post-transfusion, she experienced significant bruising and pain in her arm, which her daughter attributed to a possible infiltration during the transfusion. The pain affected her ability to perform daily activities, such as pulling up her pants, although it has improved somewhat recently.  - 06/10/2023 - Posterior lumbar infusion 2-3, 3-4, 4-5 with subsequent hematoma r/t epidural 06/13/2023   She reports numbness and weakness in her legs, which led to a fall shortly after her initial discharge from the hospital. This incident required her to be readmitted. She is currently using a walker and receiving home health physical therapy twice a week. Her physical therapist initially suggested transitioning to a cane, but a supervisor recommended continuing with the walker due to her tendency to lean to one side.  She has a history of COVID-19, which delayed her original surgery planned for April. She has not received the most recent COVID-19 vaccine. No chest pain,  shortness of breath, dizziness, or lightheadedness.      Medications: Outpatient Medications Prior to Visit  Medication Sig Note   CYCLOBENZAPRINE  HCL PO Take 5 mg by mouth 3 (three) times daily. 07/18/2023: Med Classification: Locomotor System   docusate sodium  (COLACE) 100 MG capsule Take 100 mg by mouth daily. 07/18/2023: Med Classification: Gastrointestinal Therapy Agents   gabapentin  (NEURONTIN ) 300 MG capsule TAKE 1 CAPSULE 3 TIMES DAILY    oxyCODONE -acetaminophen  (PERCOCET/ROXICET) 5-325 MG tablet Take 1-2 tablets by mouth every 4 (four) hours as needed for moderate pain (pain score 4-6).    [DISCONTINUED] CALCIUM PO Take 1 tablet by mouth daily.    [DISCONTINUED] cyclobenzaprine  (FLEXERIL ) 5 MG tablet Take 1 tablet (5 mg total) by mouth 3 (three) times daily as needed for muscle spasms.    [DISCONTINUED] docusate sodium  (COLACE) 100 MG capsule Take 1 capsule (100 mg total) by mouth 2 (two) times daily.    [DISCONTINUED] MAGNESIUM PO Take 1 tablet by mouth daily.    [DISCONTINUED] omeprazole  (PRILOSEC) 20 MG capsule Take 1 capsule (20 mg total) by mouth daily. (Patient taking differently: Take 20 mg by mouth daily as needed (acid reflux).)    [DISCONTINUED] vitamin E 180 MG (400 UNITS) capsule Take 400 Units by mouth daily.    No facility-administered medications prior to visit.        Objective    BP 127/78 (BP Location: Left Arm, Patient Position: Sitting, Cuff Size: Normal)   Pulse 89   Resp 14   Ht 5' 6 (1.676 m)   Wt 136 lb (61.7 kg)   SpO2 100%  BMI 21.95 kg/m     Physical Exam Vitals and nursing note reviewed.  Constitutional:      General: She is not in acute distress.    Appearance: Normal appearance.  HENT:     Head: Normocephalic and atraumatic.   Eyes:     General: No scleral icterus.    Extraocular Movements: Extraocular movements intact.     Conjunctiva/sclera: Conjunctivae normal.    Cardiovascular:     Rate and Rhythm: Normal rate and  regular rhythm.     Pulses: Normal pulses.     Heart sounds: Normal heart sounds.  Pulmonary:     Effort: Pulmonary effort is normal. No respiratory distress.     Breath sounds: Normal breath sounds.  Abdominal:     General: Bowel sounds are normal.   Musculoskeletal:     Right lower leg: No edema.     Left lower leg: No edema.     Comments: Back brace in place Using walker   Skin:    General: Skin is warm and dry.   Neurological:     Mental Status: She is alert and oriented to person, place, and time. Mental status is at baseline.   Psychiatric:        Mood and Affect: Mood normal.        Behavior: Behavior normal.      No results found for any visits on 07/18/23.  Assessment & Plan    Microcytic anemia -     CBC  Status post surgery  Pure hypercholesterolemia -     Lipid panel  Hypocalcemia -     Comprehensive metabolic panel with GFR  Encounter for colorectal cancer screening -     Cologuard      Post-Surgical Recovery; microcytic anemia Experiencing bruising and pain in arm possibly related to from transfusion infiltration and reabsorption. Numbness and shoulder pain improving. Using walker, advised by physical therapy due to leaning and depending more on her right leg than her left. - Recheck blood count. - Continue home health physical therapy. - Follow physical therapy recommendations for mobility aids. - Follow up with surgeon scheduled for September; defer to specialist management  Medication Management Taking gabapentin  and oxycodone . Constipation from oxycodone  managed with prunes, Dulcolax, and Colace. Uses muscle spasm medication as needed. - Continue gabapentin  and oxycodone  as needed. - Continue Dulcolax and Colace for constipation management. - Review and manage muscle spasm medication as needed.  General Health Maintenance Declines mammograms. Open to Cologuard for colon cancer screening. Aware of COVID-19 vaccine recommendation for those  over 65. - Order Cologuard for colon cancer screening. - Discuss and consider COVID-19 vaccination.   Follow-up Scheduled follow-up with surgeon in September. Evaluated by physical therapy for progression to a cane. - Follow up with surgeon in September. - Continue using walker as advised by physical therapy.  Return in about 4 months (around 11/06/2023) for CPE.      I discussed the assessment and treatment plan with the patient  The patient was provided an opportunity to ask questions and all were answered. The patient agreed with the plan and demonstrated an understanding of the instructions.   The patient was advised to call back or seek an in-person evaluation if the symptoms worsen or if the condition fails to improve as anticipated.    Carlean Charter, DO  [facility] [phone] (phone) [phone] (fax)  [facility]

## 2023-07-19 LAB — LIPID PANEL
Chol/HDL Ratio: 3 ratio (ref 0.0–4.4)
Cholesterol, Total: 219 mg/dL — ABNORMAL HIGH (ref 100–199)
HDL: 74 mg/dL (ref >39)
LDL Chol Calc (NIH): 115 mg/dL — ABNORMAL HIGH (ref 0–99)
Triglycerides: 175 mg/dL — ABNORMAL HIGH (ref 0–149)
VLDL Cholesterol Cal: 30 mg/dL (ref 5–40)

## 2023-07-19 LAB — CBC
Hematocrit: 39.7 % (ref 34.0–46.6)
Hemoglobin: 11.9 g/dL (ref 11.1–15.9)
MCH: 28.1 pg (ref 26.6–33.0)
MCHC: 30 g/dL — ABNORMAL LOW (ref 31.5–35.7)
MCV: 94 fL (ref 79–97)
Platelets: 574 10*3/uL — ABNORMAL HIGH (ref 150–450)
RBC: 4.23 x10E6/uL (ref 3.77–5.28)
RDW: 13 % (ref 11.7–15.4)
WBC: 6.5 10*3/uL (ref 3.4–10.8)

## 2023-07-19 LAB — COMPREHENSIVE METABOLIC PANEL WITH GFR
ALT: 11 IU/L (ref 0–32)
AST: 20 IU/L (ref 0–40)
Albumin: 4.4 g/dL (ref 3.8–4.8)
Alkaline Phosphatase: 156 IU/L — ABNORMAL HIGH (ref 44–121)
BUN/Creatinine Ratio: 18 (ref 12–28)
BUN: 13 mg/dL (ref 8–27)
Bilirubin Total: 0.3 mg/dL (ref 0.0–1.2)
CO2: 22 mmol/L (ref 20–29)
Calcium: 10 mg/dL (ref 8.7–10.3)
Chloride: 100 mmol/L (ref 96–106)
Creatinine, Ser: 0.72 mg/dL (ref 0.57–1.00)
Globulin, Total: 2.2 g/dL (ref 1.5–4.5)
Glucose: 89 mg/dL (ref 70–99)
Potassium: 5 mmol/L (ref 3.5–5.2)
Sodium: 138 mmol/L (ref 134–144)
Total Protein: 6.6 g/dL (ref 6.0–8.5)
eGFR: 87 mL/min/{1.73_m2} (ref >59)

## 2023-07-25 ENCOUNTER — Ambulatory Visit: Payer: Self-pay | Admitting: Family Medicine

## 2023-07-25 DIAGNOSIS — D75839 Thrombocytosis, unspecified: Secondary | ICD-10-CM

## 2023-07-25 DIAGNOSIS — R748 Abnormal levels of other serum enzymes: Secondary | ICD-10-CM

## 2023-07-25 DIAGNOSIS — R195 Other fecal abnormalities: Secondary | ICD-10-CM

## 2023-09-02 LAB — COLOGUARD

## 2023-09-18 LAB — COLOGUARD

## 2023-10-04 LAB — COLOGUARD: COLOGUARD: POSITIVE — AB

## 2023-10-08 NOTE — Addendum Note (Signed)
 Addended by: DONZELLA DOMINO on: 10/08/2023 07:52 AM   Modules accepted: Orders

## 2023-10-15 DIAGNOSIS — M48062 Spinal stenosis, lumbar region with neurogenic claudication: Secondary | ICD-10-CM | POA: Diagnosis not present

## 2023-10-15 DIAGNOSIS — M4316 Spondylolisthesis, lumbar region: Secondary | ICD-10-CM | POA: Diagnosis not present

## 2023-10-22 ENCOUNTER — Telehealth: Payer: Self-pay

## 2023-10-22 NOTE — Telephone Encounter (Signed)
 Advised

## 2023-10-22 NOTE — Telephone Encounter (Signed)
 I would recommend obtaining any urine testing during the appt scheduled for 9.17.25

## 2023-10-22 NOTE — Telephone Encounter (Signed)
 Patient has appointment with you tomorrow   Copied from CRM [id number]. Topic: Clinical - Request for Lab/Test Order >> Oct 22, 2023  8:22 AM Myrick T wrote: Reason for CRM: patient called to see if she could get a urine test requested before her appt on Wednesday. Patient is having frequent bathroom use without pain. Please f/u with patient

## 2023-10-23 ENCOUNTER — Ambulatory Visit (INDEPENDENT_AMBULATORY_CARE_PROVIDER_SITE_OTHER): Admitting: Family Medicine

## 2023-10-23 ENCOUNTER — Encounter: Payer: Self-pay | Admitting: Family Medicine

## 2023-10-23 VITALS — BP 124/67 | HR 90 | Temp 98.2°F | Ht 66.0 in | Wt 144.6 lb

## 2023-10-23 DIAGNOSIS — R350 Frequency of micturition: Secondary | ICD-10-CM

## 2023-10-23 DIAGNOSIS — N3000 Acute cystitis without hematuria: Secondary | ICD-10-CM

## 2023-10-23 LAB — POCT URINALYSIS DIPSTICK
Bilirubin, UA: NEGATIVE
Blood, UA: NEGATIVE
Glucose, UA: NEGATIVE
Ketones, UA: NEGATIVE
Nitrite, UA: POSITIVE
Odor: NORMAL
Protein, UA: POSITIVE — AB
Spec Grav, UA: 1.02 (ref 1.010–1.025)
Urobilinogen, UA: 0.2 U/dL
pH, UA: 5 (ref 5.0–8.0)

## 2023-10-23 MED ORDER — CEPHALEXIN 500 MG PO CAPS: 500.0000 mg | ORAL_CAPSULE | Freq: Three times a day (TID) | ORAL | 0 refills | Status: AC

## 2023-10-23 NOTE — Progress Notes (Signed)
 ACUTE VISIT   Patient: Vanessa Dominguez   DOB: 1945-04-09   78 y.o. Female  MRN: [id number]   PCP: Donzella Lauraine SAILOR, DO  Chief Complaint  Patient presents with   Urinary Frequency    Patient presents with urinary frequency x 6 days. Has taken some azo to try to alleviate symptoms but no relief.    Subjective    HPI HPI     Urinary Frequency    Additional comments: Patient presents with urinary frequency x 6 days. Has taken some azo to try to alleviate symptoms but no relief.       Last edited by Cherry Chiquita HERO, CMA on 10/23/2023 10:08 AM.       Discussed the use of AI scribe software for clinical note transcription with the patient, who gave verbal consent to proceed.  History of Present Illness Vanessa Dominguez is a 78 year old female who presents with symptoms of acute cystitis.  She has experienced increased urinary frequency for the past six days. She attempted to alleviate her symptoms with Azo, but did not experience any relief. She has no history of urinary tract infections. No hematuria, dysuria, fever, or abdominal pain. She mentions that if she feels the urge to urinate, she sometimes cannot make it to the bathroom in time, which is not normal for her.  A point of care urine analysis was positive for nitrites, with a little protein and some white blood cells present. She had labs collected three months ago showing normal kidney function with a creatinine level of 0.72.  She has no known allergies and is not currently on any medications for this condition.     Medications: Outpatient Medications Prior to Visit  Medication Sig   gabapentin  (NEURONTIN ) 300 MG capsule TAKE 1 CAPSULE 3 TIMES DAILY   trazodone (DESYREL) 300 MG tablet Take 300 mg by mouth at bedtime.   [DISCONTINUED] CYCLOBENZAPRINE  HCL PO Take 5 mg by mouth 3 (three) times daily.   [DISCONTINUED] docusate sodium  (COLACE) 100 MG capsule Take 100 mg by mouth daily.   [DISCONTINUED]  oxyCODONE -acetaminophen  (PERCOCET/ROXICET) 5-325 MG tablet Take 1-2 tablets by mouth every 4 (four) hours as needed for moderate pain (pain score 4-6).   No facility-administered medications prior to visit.        Objective    BP 124/67 (BP Location: Left Arm, Patient Position: Sitting, Cuff Size: Normal)   Pulse 90   Temp 98.2 F (36.8 C) (Oral)   Ht 5' 6 (1.676 m)   Wt 144 lb 9.6 oz (65.6 kg)   SpO2 98%   BMI 23.34 kg/m    Physical Exam   Physical Exam GENERAL: Well appearing, no acute distress ABDOMEN: Non-tender, no suprapubic tenderness, no CVA tenderness, not distended   No results found for any visits on 10/23/23.  Assessment & Plan     Assessment and Plan Assessment & Plan Acute cystitis without hematuria Acute cystitis characterized by increased urinary frequency for six days. Urinalysis positive for nitrites, indicating a likely bacterial infection. No hematuria, dysuria, or fever reported. No frequent urinary tract infections. No CVA tenderness, suggesting no upper urinary tract involvement. - Prescribe Keflex  500 mg orally three times a day for seven days. - Send urine culture to identify the organism and assess antibiotic sensitivity. - Instruct to return if symptoms persist after completing antibiotics, if hematuria develops, or if fever or vomiting occurs.      No follow-ups on file.  Rockie Agent, MD  [facility] [phone] (phone) [phone] (fax)  [facility]

## 2023-10-25 LAB — URINE CULTURE

## 2023-10-29 ENCOUNTER — Ambulatory Visit: Payer: Self-pay | Admitting: Family Medicine

## 2023-11-21 ENCOUNTER — Encounter: Payer: Self-pay | Admitting: Family Medicine

## 2023-11-21 ENCOUNTER — Ambulatory Visit: Admitting: Family Medicine

## 2023-11-21 VITALS — BP 108/66 | HR 88 | Temp 98.2°F | Ht 64.5 in | Wt 144.4 lb

## 2023-11-21 DIAGNOSIS — R748 Abnormal levels of other serum enzymes: Secondary | ICD-10-CM

## 2023-11-21 DIAGNOSIS — Z1212 Encounter for screening for malignant neoplasm of rectum: Secondary | ICD-10-CM | POA: Diagnosis not present

## 2023-11-21 DIAGNOSIS — Z0000 Encounter for general adult medical examination without abnormal findings: Secondary | ICD-10-CM

## 2023-11-21 DIAGNOSIS — R7989 Other specified abnormal findings of blood chemistry: Secondary | ICD-10-CM | POA: Diagnosis not present

## 2023-11-21 DIAGNOSIS — N39498 Other specified urinary incontinence: Secondary | ICD-10-CM

## 2023-11-21 DIAGNOSIS — Z23 Encounter for immunization: Secondary | ICD-10-CM

## 2023-11-21 DIAGNOSIS — E7800 Pure hypercholesterolemia, unspecified: Secondary | ICD-10-CM | POA: Diagnosis not present

## 2023-11-21 DIAGNOSIS — Z1211 Encounter for screening for malignant neoplasm of colon: Secondary | ICD-10-CM

## 2023-11-21 DIAGNOSIS — G479 Sleep disorder, unspecified: Secondary | ICD-10-CM | POA: Diagnosis not present

## 2023-11-21 MED ORDER — TRAZODONE HCL 100 MG PO TABS: 50.0000 mg | ORAL_TABLET | Freq: Every evening | ORAL | 1 refills | Status: AC | PRN

## 2023-11-21 NOTE — Progress Notes (Signed)
 Complete physical exam   Patient: Vanessa Dominguez   DOB: March 18, 1945   78 y.o. Female  MRN: [id number] Visit Date: 11/21/2023  Today's healthcare provider: LAURAINE LOISE BUOY, DO   Chief Complaint  Patient presents with   Annual Exam    Diet- General Exercise- yes tries to walk Overall Feeling- Good Sleep- Not good Concerns- Wanting sleep remedy/medications   Subjective    Vanessa Dominguez is a 78 y.o. female who presents today for a complete physical exam.   HPI HPI     Annual Exam    Additional comments: Diet- General Exercise- yes tries to walk Overall Feeling- Good Sleep- Not good Concerns- Wanting sleep remedy/medications      Last edited by Terrel Powell CROME, CMA on 11/21/2023  8:48 AM.       Vanessa Dominguez is a 78 year old female who presents for an annual physical exam.  She experiences difficulty sleeping and has taken a whole trazodone pill, which belongs to her husband, to aid her sleep. She usually does not sleep well but managed to get about six hours of sleep last night, which is more than her usual. No grogginess the following morning.  She has a history of a recent bladder infection, which led to the postponement of her colon cancer screening. She has difficulty with the preparation drink for colonoscopy, stating she can only consume a small amount before feeling nauseous.  Her diet is generally healthy, although she is not actively focusing on any specific dietary goals. She notes weight gain, which she attributes to gabapentin  use. She was approximately 130 pounds before her surgery.  She reports numbness in her feet, which she attributes to a previous surgery. She takes gabapentin  for back pain and notes that missing doses leads to issues. Gabapentin  is the only medication she is currently taking, aside from regular vitamins.  She reports occasional urinary incontinence, describing a sensation of incomplete bladder emptying. She has not tried Kegel  exercises previously.    Past Medical History:  Diagnosis Date   Arthritis    Cauda equina syndrome (HCC) 06/10/2023   GERD (gastroesophageal reflux disease)    Left bundle branch block    Chronic   Pneumonia    Past Surgical History:  Procedure Laterality Date   CATARACT EXTRACTION W/PHACO Left 12/13/2015   Procedure: CATARACT EXTRACTION PHACO AND INTRAOCULAR LENS PLACEMENT (IOC);  Surgeon: Elsie Carmine, MD;  Location: [facility] ORS;  Service: Ophthalmology;  Laterality: Left;  Lot# [id number] H US :00:36.0 AP%: 23.7 CDE: 8.52   CATARACT EXTRACTION W/PHACO Right 01/10/2016   Procedure: CATARACT EXTRACTION PHACO AND INTRAOCULAR LENS PLACEMENT (IOC);  Surgeon: Elsie Carmine, MD;  Location: [facility] ORS;  Service: Ophthalmology;  Laterality: Right;  Lot # [id number] H US : 00:38.4 AP%: 21.3 CDE: 8.17   CESAREAN SECTION     EYE SURGERY     JOINT REPLACEMENT Left 2006   Lt knee   KNEE SURGERY Left [phone]   LUMBAR FUSION     LUMBAR LAMINECTOMY FOR EPIDURAL ABSCESS N/A 06/14/2023   Procedure: EVACUATION LUMBAR EPIDURAL HEMATOMA;  Surgeon: Mavis Purchase, MD;  Location: [facility];  Service: Neurosurgery;  Laterality: N/A;  EVACUATION LUMBAR EPIDURAL HEMATOMA   TUBAL LIGATION  1989   Social History   Socioeconomic History   Marital status: Married    Spouse name: Lynwood   Number of children: 2   Years of education: Not on file   Highest education level: 12th grade  Occupational History  Occupation: retired  Tobacco Use   Smoking status: Former    Current packs/day: 0.00    Average packs/day: 0.1 packs/day for 54.6 years (5.5 ttl pk-yrs)    Types: Cigarettes    Start date: 66    Quit date: 09/2022    Years since quitting: 1.2   Smokeless tobacco: Never   Tobacco comments:    05/08/23 2-3 cig/day when smoking  Vaping Use   Vaping status: Never Used  Substance and Sexual Activity   Alcohol use: No   Drug use: No   Sexual activity: Yes    Birth control/protection: None   Other Topics Concern   Not on file  Social History Narrative   Not on file   Social Drivers of Health   Financial Resource Strain: Low Risk  (11/17/2023)   Overall Financial Resource Strain (CARDIA)    Difficulty of Paying Living Expenses: Not hard at all  Food Insecurity: No Food Insecurity (11/17/2023)   Hunger Vital Sign    Worried About Running Out of Food in the Last Year: Never true    Ran Out of Food in the Last Year: Never true  Transportation Needs: No Transportation Needs (11/17/2023)   PRAPARE - Administrator, Civil Service (Medical): No    Lack of Transportation (Non-Medical): No  Physical Activity: Inactive (11/17/2023)   Exercise Vital Sign    Days of Exercise per Week: 0 days    Minutes of Exercise per Session: Not on file  Stress: No Stress Concern Present (11/17/2023)   [employer] of Occupational Health - Occupational Stress Questionnaire    Feeling of Stress: Only a little  Recent Concern: Stress - Stress Concern Present (10/22/2023)   [employer] of Occupational Health - Occupational Stress Questionnaire    Feeling of Stress: Rather much  Social Connections: Moderately Isolated (11/17/2023)   Social Connection and Isolation Panel    Frequency of Communication with Friends and Family: More than three times a week    Frequency of Social Gatherings with Friends and Family: More than three times a week    Attends Religious Services: Never    Database administrator or Organizations: No    Attends Engineer, structural: Not on file    Marital Status: Married  Catering manager Violence: Not At Risk (05/08/2023)   Humiliation, Afraid, Rape, and Kick questionnaire    Fear of Current or Ex-Partner: No    Emotionally Abused: No    Physically Abused: No    Sexually Abused: No   Family Status  Relation Name Status   Mother Orlean Deceased at age 100   Father Koren Deceased at age 13       Died from cancer   Brother  Deceased   Neg  Hx  (Not Specified)  No partnership data on file   Family History  Problem Relation Age of Onset   Diabetes Mother    Cancer Father    Heart attack Brother        10/2016   Breast cancer Neg Hx    No Known Allergies  Patient Care Team: Stephen Turnbaugh, Lauraine SAILOR, DO as PCP - General (Family Medicine) Mavis Purchase, MD as Consulting Physician (Neurosurgery)   Medications: Outpatient Medications Prior to Visit  Medication Sig   gabapentin  (NEURONTIN ) 300 MG capsule TAKE 1 CAPSULE 3 TIMES DAILY   [DISCONTINUED] trazodone (DESYREL) 300 MG tablet Take 300 mg by mouth at bedtime.   No facility-administered medications prior to visit.  Review of Systems  Constitutional:  Negative for chills, fatigue and fever.  HENT:  Negative for congestion, ear pain, rhinorrhea, sneezing and sore throat.   Eyes: Negative.  Negative for pain and redness.  Respiratory:  Negative for cough, shortness of breath and wheezing.   Cardiovascular:  Negative for chest pain and leg swelling.  Gastrointestinal:  Negative for abdominal pain, blood in stool, constipation, diarrhea, nausea and vomiting.  Endocrine: Negative for polydipsia and polyphagia.  Genitourinary: Negative.  Negative for dysuria, flank pain, hematuria, pelvic pain, vaginal bleeding and vaginal discharge.  Musculoskeletal:  Positive for back pain (controlled with gabapentin ). Negative for arthralgias, gait problem and joint swelling.  Skin:  Negative for rash.  Neurological:  Positive for numbness (feet, at baseline). Negative for dizziness, tremors, seizures, weakness, light-headedness and headaches.  Hematological:  Negative for adenopathy.  Psychiatric/Behavioral: Negative.  Negative for behavioral problems, confusion and dysphoric mood. The patient is not nervous/anxious and is not hyperactive.       Objective    BP 108/66 (BP Location: Left Arm, Patient Position: Sitting, Cuff Size: Normal)   Pulse 88   Temp 98.2 F (36.8 C) (Oral)    Ht 5' 4.5 (1.638 m)   Wt 144 lb 6.4 oz (65.5 kg)   SpO2 97%   BMI 24.40 kg/m    Physical Exam Vitals and nursing note reviewed.  Constitutional:      General: She is awake.     Appearance: Normal appearance.  HENT:     Head: Normocephalic and atraumatic.     Right Ear: Tympanic membrane, ear canal and external ear normal.     Left Ear: Tympanic membrane, ear canal and external ear normal.     Nose: Nose normal.     Mouth/Throat:     Mouth: Mucous membranes are moist.     Pharynx: Oropharynx is clear. No oropharyngeal exudate or posterior oropharyngeal erythema.  Eyes:     General: No scleral icterus.    Extraocular Movements: Extraocular movements intact.     Conjunctiva/sclera: Conjunctivae normal.     Pupils: Pupils are equal, round, and reactive to light.  Neck:     Thyroid: No thyromegaly or thyroid tenderness.  Cardiovascular:     Rate and Rhythm: Normal rate and regular rhythm.     Pulses: Normal pulses.     Heart sounds: Normal heart sounds.  Pulmonary:     Effort: Pulmonary effort is normal. No tachypnea, bradypnea or respiratory distress.     Breath sounds: Normal breath sounds. No stridor. No wheezing, rhonchi or rales.  Abdominal:     General: Bowel sounds are normal. There is no distension.     Palpations: Abdomen is soft. There is no mass.     Tenderness: There is no abdominal tenderness. There is no guarding.     Hernia: No hernia is present.  Musculoskeletal:     Cervical back: Normal range of motion and neck supple.     Right lower leg: No edema.     Left lower leg: No edema.  Lymphadenopathy:     Cervical: No cervical adenopathy.  Skin:    General: Skin is warm and dry.  Neurological:     Mental Status: She is alert and oriented to person, place, and time. Mental status is at baseline.  Psychiatric:        Mood and Affect: Mood normal.        Behavior: Behavior normal.      Last depression screening scores  11/21/2023    8:49 AM 10/23/2023    10:05 AM 07/18/2023    8:25 AM  PHQ 2/9 Scores  PHQ - 2 Score 0 0 2  PHQ- 9 Score 3 4 4    Last fall risk screening    11/21/2023    8:48 AM  Fall Risk   Falls in the past year? 1  Number falls in past yr: 1  Injury with Fall? 0   Last Audit-C alcohol use screening    11/17/2023    5:51 PM  Alcohol Use Disorder Test (AUDIT)  1. How often do you have a drink containing alcohol? 0  3. How often do you have six or more drinks on one occasion? 0   A score of 3 or more in women, and 4 or more in men indicates increased risk for alcohol abuse, EXCEPT if all of the points are from question 1   No results found for any visits on 11/21/23.  Assessment & Plan    Routine Health Maintenance and Physical Exam  Exercise Activities and Dietary recommendations  Goals      Patient Stated     Have back surgery and not be in pain        Immunization History  Administered Date(s) Administered    sv, Bivalent, Protein Subunit Rsvpref,pf (Abrysvo) 11/02/2021   Fluad Quad(high Dose 65+) 12/18/2021   INFLUENZA, HIGH DOSE SEASONAL PF 03/15/2016, 10/16/2017, 11/21/2023   Influenza,inj,Quad PF,6+ Mos 02/09/2015   Influenza-Unspecified 11/09/2016, 11/18/2018, 11/17/2019, 12/06/2020   Moderna Covid-19 Fall Seasonal Vaccine 39yrs & older 12/18/2021   Moderna Covid-19 Vaccine Bivalent Booster 4yrs & up 04/30/2019   Moderna Sars-Covid-2 Vaccination 04/02/2019, 04/30/2019   PFIZER Comirnaty(Gray Top)Covid-19 Tri-Sucrose Vaccine 12/24/2019   Pneumococcal Conjugate-13 02/25/2017   Pneumococcal Polysaccharide-23 02/09/2015   Tdap 07/02/2017   Zoster Recombinant(Shingrix ) 10/16/2017, 12/16/2017   Zoster, Live 07/30/2012    Health Maintenance  Topic Date Due   COLON CANCER SCREENING ANNUAL FOBT  05/03/2023   Mammogram  07/17/2024 (Originally 10/04/2020)   COVID-19 Vaccine (6 - 2025-26 season) 10/06/2024 (Originally 10/07/2023)   Medicare Annual Wellness (AWV)  05/07/2024   DTaP/Tdap/Td (2 -  Td or Tdap) 07/03/2027   Pneumococcal Vaccine: 50+ Years  Completed   Influenza Vaccine  Completed   DEXA SCAN  Completed   Hepatitis C Screening  Completed   Zoster Vaccines- Shingrix   Completed   Meningococcal B Vaccine  Aged Out   Colonoscopy  Discontinued    Discussed health benefits of physical activity, and encouraged her to engage in regular exercise appropriate for her age and condition.   Annual physical exam  Sleep disturbance -     traZODone HCl; Take 0.5-1 tablets (50-100 mg total) by mouth at bedtime as needed for sleep.  Dispense: 90 tablet; Refill: 1  Elevated alkaline phosphatase level -     Comprehensive metabolic panel with GFR  Elevated platelet count -     CBC  Other urinary incontinence  Pure hypercholesterolemia -     Lipid panel  Encounter for colorectal cancer screening  Need for influenza vaccination -     Flu vaccine HIGH DOSE PF(Fluzone Trivalent)      Annual physical exam Routine adult wellness visit with discussion on general health maintenance, diet, and exercise. No acute concerns reported.  Physical exam overall unremarkable except as noted above. Routine lab work ordered as noted. Plans to receive COVID-19 booster at the pharmacy. - Discuss general diet and exercise habits. - Receive COVID-19 booster  at the pharmacy.  Sleep disturbance Insomnia effectively managed with trazodone 100 mg, providing six hours of sleep without grogginess. - Prescribe trazodone 100 mg with instructions to take half to a whole tablet at bedtime as needed.  Chronic back pain with postsurgical peripheral neuropathy Chronic back pain with postsurgical peripheral neuropathy managed with gabapentin . Reports numbness in feet post-surgery and finds gabapentin  effective for pain management. - Continue gabapentin  as prescribed.  Elevated alkaline phosphatase Previously noted elevated alkaline phosphatase level requiring follow-up to assess liver function. - Order  repeat blood work to recheck alkaline phosphatase levels.  Elevated platelet count Previously noted elevated platelet count requiring follow-up to monitor levels. - Order CBC to recheck platelet levels.  Other urinary incontinence Intermittent urinary incontinence possibly related to weakened pelvic muscles post-childbirth. Not persistent enough to warrant medication. - Recommend Kegel exercises to strengthen pelvic muscles.  Encounter for colorectal cancer screening Colorectal cancer screening postponed due to previous bladder infection. Difficulty with preparation drink noted. - Advise to inquire about alternative bowel preparation medications when rescheduling colonoscopy.  Pure hypercholesterolemia Mild, with elevated LDL of 115 on 07/18/2023.  Not currently on antihyperlipidemic medication.  Patient to continue to adhere to healthy diet/exercise.  Recheck lipid panel today.    Return in about 6 months (around 05/21/2024) for Chronic f/u.     I discussed the assessment and treatment plan with the patient  The patient was provided an opportunity to ask questions and all were answered. The patient agreed with the plan and demonstrated an understanding of the instructions.   The patient was advised to call back or seek an in-person evaluation if the symptoms worsen or if the condition fails to improve as anticipated.    LAURAINE LOISE BUOY, DO  [facility] [phone] (phone) [phone] (fax)  [facility]

## 2023-12-05 DIAGNOSIS — E7800 Pure hypercholesterolemia, unspecified: Secondary | ICD-10-CM | POA: Diagnosis not present

## 2023-12-05 DIAGNOSIS — R7989 Other specified abnormal findings of blood chemistry: Secondary | ICD-10-CM | POA: Diagnosis not present

## 2023-12-06 LAB — COMPREHENSIVE METABOLIC PANEL WITH GFR
ALT: 13 IU/L (ref 0–32)
AST: 19 IU/L (ref 0–40)
Albumin: 4.7 g/dL (ref 3.8–4.8)
Alkaline Phosphatase: 118 IU/L (ref 49–135)
BUN/Creatinine Ratio: 20 (ref 12–28)
BUN: 18 mg/dL (ref 8–27)
Bilirubin Total: 0.5 mg/dL (ref 0.0–1.2)
CO2: 21 mmol/L (ref 20–29)
Calcium: 9.9 mg/dL (ref 8.7–10.3)
Chloride: 100 mmol/L (ref 96–106)
Creatinine, Ser: 0.89 mg/dL (ref 0.57–1.00)
Globulin, Total: 2.2 g/dL (ref 1.5–4.5)
Glucose: 97 mg/dL (ref 70–99)
Potassium: 5.1 mmol/L (ref 3.5–5.2)
Sodium: 140 mmol/L (ref 134–144)
Total Protein: 6.9 g/dL (ref 6.0–8.5)
eGFR: 67 mL/min/1.73 (ref >59)

## 2023-12-06 LAB — CBC
Hematocrit: 41.3 % (ref 34.0–46.6)
Hemoglobin: 13.4 g/dL (ref 11.1–15.9)
MCH: 29.5 pg (ref 26.6–33.0)
MCHC: 32.4 g/dL (ref 31.5–35.7)
MCV: 91 fL (ref 79–97)
Platelets: 367 x10E3/uL (ref 150–450)
RBC: 4.54 x10E6/uL (ref 3.77–5.28)
RDW: 14.3 % (ref 11.7–15.4)
WBC: 4.8 x10E3/uL (ref 3.4–10.8)

## 2023-12-06 LAB — LIPID PANEL
Chol/HDL Ratio: 2.7 ratio (ref 0.0–4.4)
Cholesterol, Total: 221 mg/dL — ABNORMAL HIGH (ref 100–199)
HDL: 82 mg/dL (ref >39)
LDL Chol Calc (NIH): 120 mg/dL — ABNORMAL HIGH (ref 0–99)
Triglycerides: 107 mg/dL (ref 0–149)
VLDL Cholesterol Cal: 19 mg/dL (ref 5–40)

## 2023-12-11 ENCOUNTER — Ambulatory Visit: Payer: Self-pay | Admitting: Family Medicine

## 2024-02-12 ENCOUNTER — Encounter: Payer: Self-pay | Admitting: Gastroenterology

## 2024-02-12 ENCOUNTER — Ambulatory Visit: Admitting: Anesthesiology

## 2024-02-12 ENCOUNTER — Ambulatory Visit
Admission: RE | Admit: 2024-02-12 | Discharge: 2024-02-12 | Disposition: A | Discharge status: Home / Self Care | Attending: Gastroenterology | Admitting: Gastroenterology

## 2024-02-12 ENCOUNTER — Encounter: Admission: RE | Disposition: A | Payer: Self-pay | Attending: Gastroenterology

## 2024-02-12 DIAGNOSIS — D122 Benign neoplasm of ascending colon: Secondary | ICD-10-CM | POA: Insufficient documentation

## 2024-02-12 DIAGNOSIS — D120 Benign neoplasm of cecum: Secondary | ICD-10-CM | POA: Insufficient documentation

## 2024-02-12 DIAGNOSIS — K219 Gastro-esophageal reflux disease without esophagitis: Secondary | ICD-10-CM | POA: Insufficient documentation

## 2024-02-12 DIAGNOSIS — Z1211 Encounter for screening for malignant neoplasm of colon: Secondary | ICD-10-CM | POA: Insufficient documentation

## 2024-02-12 DIAGNOSIS — K5730 Diverticulosis of large intestine without perforation or abscess without bleeding: Secondary | ICD-10-CM | POA: Insufficient documentation

## 2024-02-12 DIAGNOSIS — Z87891 Personal history of nicotine dependence: Secondary | ICD-10-CM | POA: Insufficient documentation

## 2024-02-12 HISTORY — PX: HEMOSTASIS CLIP PLACEMENT: SHX6857

## 2024-02-12 HISTORY — PX: COLONOSCOPY: SHX5424

## 2024-02-12 HISTORY — PX: POLYPECTOMY: SHX149

## 2024-02-12 HISTORY — PX: SUBMUCOSAL INJECTION: SHX5543

## 2024-02-12 SURGERY — COLONOSCOPY
Anesthesia: General

## 2024-02-12 MED ORDER — PROPOFOL 500 MG/50ML IV EMUL
INTRAVENOUS | Status: DC | PRN
  Administered 2024-02-12: 75 ug/kg/min via INTRAVENOUS

## 2024-02-12 MED ORDER — LIDOCAINE HCL (PF) 2 % IJ SOLN
INTRAMUSCULAR | Status: AC
  Filled 2024-02-12: qty 5

## 2024-02-12 MED ORDER — DEXMEDETOMIDINE HCL IN NACL 80 MCG/20ML IV SOLN
INTRAVENOUS | Status: DC | PRN
  Administered 2024-02-12: 8 ug via INTRAVENOUS
  Administered 2024-02-12: 12 ug via INTRAVENOUS

## 2024-02-12 MED ORDER — SODIUM CHLORIDE 0.9 % IV SOLN
INTRAVENOUS | Status: DC
  Administered 2024-02-12: 500 mL via INTRAVENOUS

## 2024-02-12 MED ORDER — EPHEDRINE 5 MG/ML INJ
INTRAVENOUS | Status: AC
  Filled 2024-02-12: qty 5

## 2024-02-12 MED ORDER — KETOROLAC TROMETHAMINE 30 MG/ML IJ SOLN
INTRAMUSCULAR | Status: AC
  Filled 2024-02-12: qty 1

## 2024-02-12 MED ORDER — PROPOFOL 10 MG/ML IV BOLUS
INTRAVENOUS | Status: DC | PRN
  Administered 2024-02-12: 20 mg via INTRAVENOUS
  Administered 2024-02-12: 50 mg via INTRAVENOUS
  Administered 2024-02-12: 30 mg via INTRAVENOUS

## 2024-02-12 MED ORDER — ONDANSETRON HCL 4 MG/2ML IJ SOLN
INTRAMUSCULAR | Status: AC
  Filled 2024-02-12: qty 2

## 2024-02-12 MED ORDER — LIDOCAINE HCL (CARDIAC) PF 100 MG/5ML IV SOSY
PREFILLED_SYRINGE | INTRAVENOUS | Status: DC | PRN
  Administered 2024-02-12: 60 mg via INTRAVENOUS

## 2024-02-12 MED ORDER — EPHEDRINE SULFATE-NACL 50-0.9 MG/10ML-% IV SOSY
PREFILLED_SYRINGE | INTRAVENOUS | Status: DC | PRN
  Administered 2024-02-12: 10 mg via INTRAVENOUS

## 2024-02-12 NOTE — Anesthesia Preprocedure Evaluation (Signed)
 "                                  Anesthesia Evaluation  Patient identified by MRN, date of birth, ID band Patient awake    Reviewed: Allergy & Precautions, H&P , NPO status , Patient's Chart, lab work & pertinent test results  Airway Mallampati: II  TM Distance: >3 FB Neck ROM: full    Dental  (+) Chipped   Pulmonary neg pulmonary ROS, former smoker   Pulmonary exam normal breath sounds clear to auscultation       Cardiovascular negative cardio ROS Normal cardiovascular exam+ dysrhythmias  Rhythm:regular Rate:Normal  LBBB   Neuro/Psych S/p 3 level lumbar fusion 06/10/23.  Now has lower extremity weakness and urinary incontinence. negative neurological ROS  negative psych ROS   GI/Hepatic negative GI ROS, Neg liver ROS,GERD  ,,  Endo/Other  negative endocrine ROS    Renal/GU negative Renal ROS  negative genitourinary   Musculoskeletal   Abdominal   Peds  Hematology negative hematology ROS (+)   Anesthesia Other Findings Past Medical History: No date: Arthritis 06/10/2023: Cauda equina syndrome (HCC) No date: GERD (gastroesophageal reflux disease) No date: Left bundle branch block     Comment:  Chronic No date: Pneumonia  Past Surgical History: 12/13/2015: CATARACT EXTRACTION W/PHACO; Left     Comment:  Procedure: CATARACT EXTRACTION PHACO AND INTRAOCULAR               LENS PLACEMENT (IOC);  Surgeon: Elsie Carmine, MD;                Location: [facility] ORS;  Service: Ophthalmology;  Laterality:              Left;  Lot# [id number] H US :00:36.0 AP%: 23.7 CDE: 8.52 01/10/2016: CATARACT EXTRACTION W/PHACO; Right     Comment:  Procedure: CATARACT EXTRACTION PHACO AND INTRAOCULAR               LENS PLACEMENT (IOC);  Surgeon: Elsie Carmine, MD;                Location: [facility] ORS;  Service: Ophthalmology;  Laterality:              Right;  Lot # [id number] H US : 00:38.4 AP%: 21.3 CDE: 8.17 No date: CESAREAN SECTION No date: EYE SURGERY 2006: JOINT  REPLACEMENT; Left     Comment:  Lt knee [phone]: KNEE SURGERY; Left No date: LUMBAR FUSION 06/14/2023: LUMBAR LAMINECTOMY FOR EPIDURAL ABSCESS; N/A     Comment:  Procedure: EVACUATION LUMBAR EPIDURAL HEMATOMA;                Surgeon: Mavis Purchase, MD;  Location: [facility] OR;                Service: Neurosurgery;  Laterality: N/A;  EVACUATION               LUMBAR EPIDURAL HEMATOMA 1989: TUBAL LIGATION     Reproductive/Obstetrics negative OB ROS                              Anesthesia Physical Anesthesia Plan  ASA: 2  Anesthesia Plan: General   Post-op Pain Management: Minimal or no pain anticipated   Induction: Intravenous  PONV Risk Score and Plan: 2 and Propofol  infusion and TIVA  Airway Management Planned: Nasal Cannula  Additional Equipment: None  Intra-op Plan:   Post-operative Plan:   Informed Consent: I have reviewed the patients History and Physical, chart, labs and discussed the procedure including the risks, benefits and alternatives for the proposed anesthesia with the patient or authorized representative who has indicated his/her understanding and acceptance.     Dental advisory given  Plan Discussed with: CRNA and Surgeon  Anesthesia Plan Comments: (Discussed risks of anesthesia with patient, including possibility of difficulty with spontaneous ventilation under anesthesia necessitating airway intervention, PONV, and rare risks such as cardiac or respiratory or neurological events, and allergic reactions. Discussed the role of CRNA in patient's perioperative care. Patient understands.)         Anesthesia Quick Evaluation  "

## 2024-02-12 NOTE — H&P (Signed)
 "                                                                                                                           Vanessa Dominguez , MD [address], [address], [place], [place], [address] Phone: [phone] Fax: [phone]  Primary Care Physician:  Vanessa Lauraine SAILOR, DO   Pre-Procedure History & Physical: HPI:  Vanessa Dominguez is a 79 y.o. female is here for an colonoscopy.   Past Medical History:  Diagnosis Date   Arthritis    Cauda equina syndrome (HCC) 06/10/2023   GERD (gastroesophageal reflux disease)    Left bundle branch block    Chronic   Pneumonia     Past Surgical History:  Procedure Laterality Date   CATARACT EXTRACTION W/PHACO Left 12/13/2015   Procedure: CATARACT EXTRACTION PHACO AND INTRAOCULAR LENS PLACEMENT (IOC);  Surgeon: Vanessa Carmine, MD;  Location: [facility] ORS;  Service: Ophthalmology;  Laterality: Left;  Lot# [id number] H US :00:36.0 AP%: 23.7 CDE: 8.52   CATARACT EXTRACTION W/PHACO Right 01/10/2016   Procedure: CATARACT EXTRACTION PHACO AND INTRAOCULAR LENS PLACEMENT (IOC);  Surgeon: Vanessa Carmine, MD;  Location: [facility] ORS;  Service: Ophthalmology;  Laterality: Right;  Lot # [id number] H US : 00:38.4 AP%: 21.3 CDE: 8.17   CESAREAN SECTION     EYE SURGERY     JOINT REPLACEMENT Left 2006   Lt knee   KNEE SURGERY Left [phone]   LUMBAR FUSION     LUMBAR LAMINECTOMY FOR EPIDURAL ABSCESS N/A 06/14/2023   Procedure: EVACUATION LUMBAR EPIDURAL HEMATOMA;  Surgeon: Vanessa Purchase, MD;  Location: [facility];  Service: Neurosurgery;  Laterality: N/A;  EVACUATION LUMBAR EPIDURAL HEMATOMA   TUBAL LIGATION  1989    Prior to Admission medications  Medication Sig Start Date End Date Taking? Authorizing Provider  meloxicam (MOBIC) 15 MG tablet Take 15 mg by mouth daily.   Yes [provider]  gabapentin  (NEURONTIN ) 300 MG capsule TAKE 1 CAPSULE 3 TIMES DAILY 07/28/20   Bacigalupo, Vanessa HERO, MD  traZODone  (DESYREL ) 100 MG tablet Take 0.5-1 tablets  (50-100 mg total) by mouth at bedtime as needed for sleep. 11/21/23   Vanessa Lauraine SAILOR, DO    Allergies as of 02/04/2024   (No Known Allergies)    Family History  Problem Relation Age of Onset   Diabetes Mother    Cancer Father    Heart attack Brother        10/2016   Breast cancer Neg Hx     Social History   Socioeconomic History   Marital status: Married    Spouse name: Vanessa Dominguez   Number of children: 2   Years of education: Not on file   Highest education level: 12th grade  Occupational History   Occupation: retired  Tobacco Use   Smoking status: Former    Current packs/day: 0.00    Average packs/day: 0.1 packs/day for 54.6 years (5.5 ttl pk-yrs)    Types: Cigarettes    Start  date: 70    Quit date: 09/2022    Years since quitting: 1.4   Smokeless tobacco: Never   Tobacco comments:    05/08/23 2-3 cig/day when smoking  Vaping Use   Vaping status: Never Used  Substance and Sexual Activity   Alcohol use: No   Drug use: No   Sexual activity: Yes    Birth control/protection: None  Other Topics Concern   Not on file  Social History Narrative   Not on file   Social Drivers of Health   Tobacco Use: Medium Risk (11/21/2023)   Patient History    Smoking Tobacco Use: Former    Smokeless Tobacco Use: Never    Passive Exposure: Not on Actuary Strain: Low Risk (11/17/2023)   Overall Financial Resource Strain (CARDIA)    Difficulty of Paying Living Expenses: Not hard at all  Food Insecurity: No Food Insecurity (11/17/2023)   Epic    Worried About Radiation Protection Practitioner of Food in the Last Year: Never true    Ran Out of Food in the Last Year: Never true  Transportation Needs: No Transportation Needs (11/17/2023)   Epic    Lack of Transportation (Medical): No    Lack of Transportation (Non-Medical): No  Physical Activity: Inactive (11/17/2023)   Exercise Vital Sign    Days of Exercise per Week: 0 days    Minutes of Exercise per Session: Not on file  Stress:  No Stress Concern Present (11/17/2023)   [employer] of Occupational Health - Occupational Stress Questionnaire    Feeling of Stress: Only a little  Recent Concern: Stress - Stress Concern Present (10/22/2023)   [employer] of Occupational Health - Occupational Stress Questionnaire    Feeling of Stress: Rather much  Social Connections: Moderately Isolated (11/17/2023)   Social Connection and Isolation Panel    Frequency of Communication with Friends and Family: More than three times a week    Frequency of Social Gatherings with Friends and Family: More than three times a week    Attends Religious Services: Never    Database Administrator or Organizations: No    Attends Engineer, Structural: Not on file    Marital Status: Married  Catering Manager Violence: Not At Risk (05/08/2023)   Humiliation, Afraid, Rape, and Kick questionnaire    Fear of Current or Ex-Partner: No    Emotionally Abused: No    Physically Abused: No    Sexually Abused: No  Depression (PHQ2-9): Low Risk (11/21/2023)   Depression (PHQ2-9)    PHQ-2 Score: 3  Alcohol Screen: Low Risk (10/22/2023)   Alcohol Screen    Last Alcohol Screening Score (AUDIT): 0  Housing: Low Risk (11/17/2023)   Epic    Unable to Pay for Housing in the Last Year: No    Number of Times Moved in the Last Year: 0    Homeless in the Last Year: No  Utilities: Not At Risk (05/08/2023)   [employer] Utilities    Threatened with loss of utilities: No  Health Literacy: Adequate Health Literacy (05/08/2023)   B1300 Health Literacy    Frequency of need for help with medical instructions: Never    Review of Systems: See HPI, otherwise negative ROS  Physical Exam: There were no vitals taken for this visit. General:   Alert,  pleasant and cooperative in NAD Head:  Normocephalic and atraumatic. Neck:  Supple; no masses or thyromegaly. Lungs:  Clear throughout to auscultation, normal respiratory effort.  Heart:  +S1, +S2, Regular rate  and rhythm, No edema. Abdomen:  Soft, nontender and nondistended. Normal bowel sounds, without guarding, and without rebound.   Neurologic:  Alert and  oriented x4;  grossly normal neurologically.  Impression/Plan: Vanessa Dominguez is here for an colonoscopy to be performed for Screening colonoscopy average risk   Risks, benefits, limitations, and alternatives regarding  colonoscopy have been reviewed with the patient.  Questions have been answered.  All parties agreeable.   Vanessa Kung, MD  02/12/2024, 10:53 AM  "

## 2024-02-12 NOTE — Op Note (Signed)
 [facility] Gastroenterology Patient Name: Vanessa Dominguez Procedure Date: 02/12/2024 10:57 AM MRN: [id number] Account #: [id number] Date of Birth: 1945/04/11 Admit Type: Outpatient Age: 79 Room: [facility] ENDO ROOM 3 Gender: Female Note Status: Finalized Instrument Name: Colon Scope [id number] Procedure:             Colonoscopy Indications:           Screening for colorectal malignant neoplasm Providers:             Ruel Kung MD, MD Referring MD:          Lauraine GEANNIE Buoy (Referring MD) Medicines:             Monitored Anesthesia Care Complications:         No immediate complications. Procedure:             Pre-Anesthesia Assessment:                        - Prior to the procedure, a History and Physical was                         performed, and patient medications, allergies and                         sensitivities were reviewed. The patient's tolerance                         of previous anesthesia was reviewed.                        - The risks and benefits of the procedure and the                         sedation options and risks were discussed with the                         patient. All questions were answered and informed                         consent was obtained.                        - ASA Grade Assessment: II - A patient with mild                         systemic disease.                        After obtaining informed consent, the colonoscope was                         passed under direct vision. Throughout the procedure,                         the patient's blood pressure, pulse, and oxygen                         saturations were monitored continuously. The                         Colonoscope was  introduced through the anus and                         advanced to the the cecum, identified by the                         appendiceal orifice. The colonoscopy was performed                         with ease. The patient tolerated the procedure well.                          The quality of the bowel preparation was good. The                         ileocecal valve, appendiceal orifice, and rectum were                         photographed. Findings:      The perianal and digital rectal examinations were normal.      A 3 mm polyp was found in the cecum. The polyp was sessile. The polyp       was removed with a cold biopsy forceps. Resection and retrieval were       complete.      A 5 mm polyp was found in the cecum. The polyp was sessile. The polyp       was removed with a cold snare. Resection and retrieval were complete.      Seven sessile polyps were found in the ascending colon. The polyps were       5 to 6 mm in size. These polyps were removed with a cold snare.       Resection and retrieval were complete.      A 12 mm polyp was found in the ascending colon. The polyp was sessile.       Preparations were made for mucosal resection. Demarcation of the lesion       was performed with high-definition white light to clearly identify the       boundaries of the lesion. Eleview was injected to raise the lesion.       Snare mucosal resection was performed. Resection was complete, and       retrieval was complete. Resected tissue margins were examined and clear       of polyp tissue. Fulguration to ablate the lesion remnants by snare was       successful. To prevent bleeding after mucosal resection, four hemostatic       clips were successfully placed. Clip manufacturer: [employer].       There was no bleeding at the end of the procedure.      A 20 mm polyp was found in the ascending colon. The polyp was sessile.       Preparations were made for mucosal resection. Demarcation of the lesion       was performed during the procedure, with high-definition white light to       clearly identify the boundaries of the lesion. Eleview was injected to       raise the lesion. Snare mucosal resection was performed. Resection was       complete, and  retrieval was complete. Resected tissue margins were  examined and clear of polyp tissue. To prevent bleeding after the       polypectomy, two hemostatic clips were successfully placed. Clip       manufacturer: [employer]. There was no bleeding at the end of the       procedure.      Multiple medium-mouthed diverticula were found in the sigmoid colon.      The exam was otherwise without abnormality. Impression:            - One 3 mm polyp in the cecum, removed with a cold                         biopsy forceps. Resected and retrieved.                        - One 5 mm polyp in the cecum, removed with a cold                         snare. Resected and retrieved.                        - Seven 5 to 6 mm polyps in the ascending colon,                         removed with a cold snare. Resected and retrieved.                        - One 12 mm polyp in the ascending colon, removed with                         mucosal resection. Resected and retrieved. Treated                         with a hot snare. Clips were placed. Clip                         manufacturer: [employer].                        - One 20 mm polyp in the ascending colon, removed with                         mucosal resection. Resected and retrieved. Clips were                         placed. Clip manufacturer: [employer].                        - Diverticulosis in the sigmoid colon.                        - The examination was otherwise normal.                        - Mucosal resection was performed. Resection was                         complete, and retrieval was complete.                        -  Mucosal resection was performed. Resection was                         complete, and retrieval was complete. Recommendation:        - Discharge patient to home (with escort).                        - Resume previous diet.                        - Continue present medications.                        -  Await pathology results.                        - Repeat colonoscopy in 6 months for surveillance.                        - Procedure took over 30 mins in comparison to a                         normal 15 minutes due to numerous polyps and                         techniques used to resect Procedure Code(s):     --- Professional ---                        [phone], Colonoscopy, flexible; with endoscopic mucosal                         resection                        [id number], 59, Colonoscopy, flexible; with removal of                         tumor(s), polyp(s), or other lesion(s) by snare                         technique                        45380, 59, Colonoscopy, flexible; with biopsy, single                         or multiple Diagnosis Code(s):     --- Professional ---                        Z12.11, Encounter for screening for malignant neoplasm                         of colon                        D12.0, Benign neoplasm of cecum                        D12.2, Benign neoplasm of ascending colon  K57.30, Diverticulosis of large intestine without                         perforation or abscess without bleeding CPT copyright 2022 American Medical Association. All rights reserved. The codes documented in this report are preliminary and upon coder review may  be revised to meet current compliance requirements. Ruel Kung, MD Ruel Kung MD, MD 02/12/2024 12:01:16 PM This report has been signed electronically. Number of Addenda: 0 Note Initiated On: 02/12/2024 10:57 AM Scope Withdrawal Time: 0 hours 36 minutes 6 seconds  Total Procedure Duration: 0 hours 39 minutes 27 seconds  Estimated Blood Loss:  Estimated blood loss: none.      [facility]

## 2024-02-12 NOTE — Transfer of Care (Signed)
 Immediate Anesthesia Transfer of Care Note  Patient: Vanessa Dominguez  Procedure(s) Performed: COLONOSCOPY POLYPECTOMY, INTESTINE INJECTION, SUBMUCOSAL CONTROL OF HEMORRHAGE, GI TRACT, ENDOSCOPIC, BY CLIPPING OR OVERSEWING  Patient Location: PACU  Anesthesia Type:General  Level of Consciousness: sedated  Airway & Oxygen Therapy: Patient Spontanous Breathing  Post-op Assessment: Report given to RN and Post -op Vital signs reviewed and stable  Post vital signs: Reviewed and stable  Last Vitals:  Vitals Value Taken Time  BP 99/51 02/12/24 11:59  Temp 35.8 C 02/12/24 11:59  Pulse 76 02/12/24 12:01  Resp 21 02/12/24 12:01  SpO2 98 % 02/12/24 12:01  Vitals shown include unfiled device data.  Last Pain:  Vitals:   02/12/24 1159  TempSrc: Temporal  PainSc: Asleep         Complications: No notable events documented.

## 2024-02-13 LAB — SURGICAL PATHOLOGY

## 2024-02-13 NOTE — Anesthesia Postprocedure Evaluation (Signed)
"   Anesthesia Post Note  Patient: Vanessa Dominguez  Procedure(s) Performed: COLONOSCOPY POLYPECTOMY, INTESTINE INJECTION, SUBMUCOSAL CONTROL OF HEMORRHAGE, GI TRACT, ENDOSCOPIC, BY CLIPPING OR OVERSEWING  Patient location during evaluation: Endoscopy Anesthesia Type: General Level of consciousness: awake and alert Pain management: pain level controlled Vital Signs Assessment: post-procedure vital signs reviewed and stable Respiratory status: spontaneous breathing, nonlabored ventilation, respiratory function stable and patient connected to nasal cannula oxygen Cardiovascular status: blood pressure returned to baseline and stable Postop Assessment: no apparent nausea or vomiting Anesthetic complications: no   No notable events documented.   Last Vitals:  Vitals:   02/12/24 1159 02/12/24 1209  BP: (!) 99/51 100/63  Pulse: 77 73  Resp: 17 16  Temp: (!) 35.8 C   SpO2: 100% 100%    Last Pain:  Vitals:   02/12/24 1209  TempSrc:   PainSc: 0-No pain                 Debby Mines      "

## 2024-03-04 ENCOUNTER — Ambulatory Visit: Payer: Self-pay | Admitting: Gastroenterology

## 2024-03-04 NOTE — Telephone Encounter (Signed)
 Please advise.    Copied from CRM [id number]. Topic: Clinical - Lab/Test Results >> Mar 04, 2024  1:10 PM Delon DASEN wrote: Reason for CRM: calling for colonoscopy results- please call [phone]

## 2024-05-13 ENCOUNTER — Ambulatory Visit

## 2024-05-21 ENCOUNTER — Ambulatory Visit: Admitting: Family Medicine
